# Patient Record
Sex: Female | Born: 1980 | Race: Black or African American | Hispanic: No | Marital: Single | Smoking: Never smoker
Health system: Southern US, Community
[De-identification: ages and names within clinical notes are randomized; demographics above are authoritative.]

## PROBLEM LIST (undated history)

## (undated) DIAGNOSIS — J302 Other seasonal allergic rhinitis: Secondary | ICD-10-CM

## (undated) DIAGNOSIS — B9689 Other specified bacterial agents as the cause of diseases classified elsewhere: Secondary | ICD-10-CM

## (undated) DIAGNOSIS — E786 Lipoprotein deficiency: Secondary | ICD-10-CM

## (undated) DIAGNOSIS — E669 Obesity, unspecified: Secondary | ICD-10-CM

## (undated) DIAGNOSIS — A749 Chlamydial infection, unspecified: Secondary | ICD-10-CM

## (undated) DIAGNOSIS — N760 Acute vaginitis: Secondary | ICD-10-CM

## (undated) DIAGNOSIS — N390 Urinary tract infection, site not specified: Secondary | ICD-10-CM

## (undated) HISTORY — DX: Other seasonal allergic rhinitis: J30.2

## (undated) HISTORY — DX: Obesity, unspecified: E66.9

## (undated) HISTORY — PX: TUBAL LIGATION: SHX77

## (undated) HISTORY — DX: Lipoprotein deficiency: E78.6

---
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
## 2001-01-25 ENCOUNTER — Emergency Department (HOSPITAL_COMMUNITY): Admission: EM | Admit: 2001-01-25 | Discharge: 2001-01-25 | Payer: Self-pay | Admitting: Emergency Medicine

## 2001-11-28 ENCOUNTER — Emergency Department (HOSPITAL_COMMUNITY): Admission: EM | Admit: 2001-11-28 | Discharge: 2001-11-28 | Payer: Self-pay | Admitting: Emergency Medicine

## 2002-10-02 ENCOUNTER — Other Ambulatory Visit: Admission: RE | Admit: 2002-10-02 | Discharge: 2002-10-02 | Payer: Self-pay | Admitting: Obstetrics and Gynecology

## 2002-10-02 ENCOUNTER — Other Ambulatory Visit: Admission: RE | Admit: 2002-10-02 | Discharge: 2002-10-02 | Payer: Self-pay | Admitting: Gynecology

## 2002-10-12 ENCOUNTER — Encounter: Payer: Self-pay | Admitting: Obstetrics and Gynecology

## 2002-10-12 ENCOUNTER — Inpatient Hospital Stay (HOSPITAL_COMMUNITY): Admission: AD | Admit: 2002-10-12 | Discharge: 2002-10-12 | Payer: Self-pay | Admitting: Obstetrics and Gynecology

## 2003-02-19 ENCOUNTER — Other Ambulatory Visit: Admission: RE | Admit: 2003-02-19 | Discharge: 2003-02-19 | Payer: Self-pay | Admitting: Obstetrics and Gynecology

## 2003-05-11 ENCOUNTER — Inpatient Hospital Stay (HOSPITAL_COMMUNITY): Admission: RE | Admit: 2003-05-11 | Discharge: 2003-05-14 | Payer: Self-pay | Admitting: Obstetrics & Gynecology

## 2003-05-25 ENCOUNTER — Inpatient Hospital Stay (HOSPITAL_COMMUNITY): Admission: AD | Admit: 2003-05-25 | Discharge: 2003-05-26 | Payer: Self-pay | Admitting: Obstetrics and Gynecology

## 2003-06-08 ENCOUNTER — Other Ambulatory Visit: Admission: RE | Admit: 2003-06-08 | Discharge: 2003-06-08 | Payer: Self-pay | Admitting: Obstetrics and Gynecology

## 2003-06-08 ENCOUNTER — Other Ambulatory Visit: Admission: RE | Admit: 2003-06-08 | Discharge: 2003-06-08 | Payer: Self-pay | Admitting: Gynecology

## 2004-02-12 ENCOUNTER — Ambulatory Visit: Payer: Self-pay | Admitting: Family Medicine

## 2004-02-16 ENCOUNTER — Ambulatory Visit: Payer: Self-pay | Admitting: *Deleted

## 2004-02-24 ENCOUNTER — Other Ambulatory Visit: Admission: RE | Admit: 2004-02-24 | Discharge: 2004-02-24 | Payer: Self-pay | Admitting: Obstetrics & Gynecology

## 2004-03-11 ENCOUNTER — Ambulatory Visit: Payer: Self-pay | Admitting: Family Medicine

## 2004-08-03 ENCOUNTER — Ambulatory Visit: Payer: Self-pay | Admitting: Family Medicine

## 2004-08-04 ENCOUNTER — Ambulatory Visit: Payer: Self-pay | Admitting: Family Medicine

## 2004-08-04 ENCOUNTER — Encounter (INDEPENDENT_AMBULATORY_CARE_PROVIDER_SITE_OTHER): Payer: Self-pay | Admitting: Family Medicine

## 2004-08-04 LAB — CONVERTED CEMR LAB: Pap Smear: NORMAL

## 2004-10-26 ENCOUNTER — Ambulatory Visit: Payer: Self-pay | Admitting: Family Medicine

## 2004-11-18 ENCOUNTER — Ambulatory Visit: Payer: Self-pay | Admitting: Family Medicine

## 2005-01-17 ENCOUNTER — Ambulatory Visit: Payer: Self-pay | Admitting: Family Medicine

## 2005-04-11 ENCOUNTER — Ambulatory Visit: Payer: Self-pay | Admitting: Family Medicine

## 2005-07-03 ENCOUNTER — Ambulatory Visit: Payer: Self-pay | Admitting: Family Medicine

## 2005-09-22 ENCOUNTER — Ambulatory Visit: Payer: Self-pay | Admitting: Family Medicine

## 2005-12-15 ENCOUNTER — Ambulatory Visit: Payer: Self-pay | Admitting: Family Medicine

## 2005-12-28 ENCOUNTER — Ambulatory Visit: Payer: Self-pay | Admitting: Family Medicine

## 2006-03-08 ENCOUNTER — Ambulatory Visit: Payer: Self-pay | Admitting: Family Medicine

## 2006-04-03 ENCOUNTER — Ambulatory Visit: Payer: Self-pay | Admitting: Family Medicine

## 2006-04-26 ENCOUNTER — Emergency Department (HOSPITAL_COMMUNITY): Admission: EM | Admit: 2006-04-26 | Discharge: 2006-04-26 | Payer: Self-pay | Admitting: Family Medicine

## 2006-05-31 ENCOUNTER — Ambulatory Visit: Payer: Self-pay | Admitting: Family Medicine

## 2006-08-22 ENCOUNTER — Ambulatory Visit: Payer: Self-pay | Admitting: Family Medicine

## 2006-10-30 ENCOUNTER — Emergency Department (HOSPITAL_COMMUNITY): Admission: EM | Admit: 2006-10-30 | Discharge: 2006-10-30 | Payer: Self-pay | Admitting: Emergency Medicine

## 2006-11-03 ENCOUNTER — Encounter (INDEPENDENT_AMBULATORY_CARE_PROVIDER_SITE_OTHER): Payer: Self-pay | Admitting: Family Medicine

## 2006-11-03 DIAGNOSIS — Z87448 Personal history of other diseases of urinary system: Secondary | ICD-10-CM | POA: Insufficient documentation

## 2006-11-20 ENCOUNTER — Ambulatory Visit: Payer: Self-pay | Admitting: Family Medicine

## 2006-11-20 ENCOUNTER — Encounter (INDEPENDENT_AMBULATORY_CARE_PROVIDER_SITE_OTHER): Payer: Self-pay | Admitting: Family Medicine

## 2006-11-20 LAB — CONVERTED CEMR LAB
Bilirubin Urine: NEGATIVE
Blood in Urine, dipstick: NEGATIVE
Chlamydia, DNA Probe: NEGATIVE
GC Probe Amp, Genital: NEGATIVE
Glucose, Urine, Semiquant: NEGATIVE
Ketones, urine, test strip: NEGATIVE
Nitrite: NEGATIVE
Protein, U semiquant: 30
Specific Gravity, Urine: >=1.03
Urobilinogen, UA: 0.2
WBC Urine, dipstick: NEGATIVE
pH: 6

## 2006-11-26 ENCOUNTER — Encounter (INDEPENDENT_AMBULATORY_CARE_PROVIDER_SITE_OTHER): Payer: Self-pay | Admitting: Family Medicine

## 2006-11-28 ENCOUNTER — Encounter (INDEPENDENT_AMBULATORY_CARE_PROVIDER_SITE_OTHER): Payer: Self-pay | Admitting: *Deleted

## 2007-01-31 ENCOUNTER — Telehealth (INDEPENDENT_AMBULATORY_CARE_PROVIDER_SITE_OTHER): Payer: Self-pay | Admitting: Family Medicine

## 2007-04-01 ENCOUNTER — Ambulatory Visit: Payer: Self-pay | Admitting: Family Medicine

## 2007-04-01 DIAGNOSIS — L639 Alopecia areata, unspecified: Secondary | ICD-10-CM | POA: Insufficient documentation

## 2007-04-01 DIAGNOSIS — N926 Irregular menstruation, unspecified: Secondary | ICD-10-CM | POA: Insufficient documentation

## 2007-09-17 ENCOUNTER — Ambulatory Visit: Payer: Self-pay | Admitting: Family Medicine

## 2007-11-11 ENCOUNTER — Emergency Department (HOSPITAL_COMMUNITY): Admission: EM | Admit: 2007-11-11 | Discharge: 2007-11-11 | Payer: Self-pay | Admitting: Family Medicine

## 2008-03-13 HISTORY — PX: COLPOSCOPY: SHX161

## 2008-08-19 ENCOUNTER — Inpatient Hospital Stay (HOSPITAL_COMMUNITY): Admission: AD | Admit: 2008-08-19 | Discharge: 2008-08-19 | Payer: Self-pay | Admitting: Obstetrics and Gynecology

## 2008-09-10 ENCOUNTER — Encounter (INDEPENDENT_AMBULATORY_CARE_PROVIDER_SITE_OTHER): Payer: Self-pay | Admitting: Obstetrics & Gynecology

## 2008-09-10 ENCOUNTER — Inpatient Hospital Stay (HOSPITAL_COMMUNITY): Admission: RE | Admit: 2008-09-10 | Discharge: 2008-09-12 | Payer: Self-pay | Admitting: Obstetrics & Gynecology

## 2009-02-27 IMAGING — CR DG CHEST 2V
2 series · 2 of 2 positions shown · non-contrast
Comparison: none

CLINICAL DATA: Right-sided chest pain. 
 CHEST - 2 VIEW:

[view not recorded (1 of 2)]
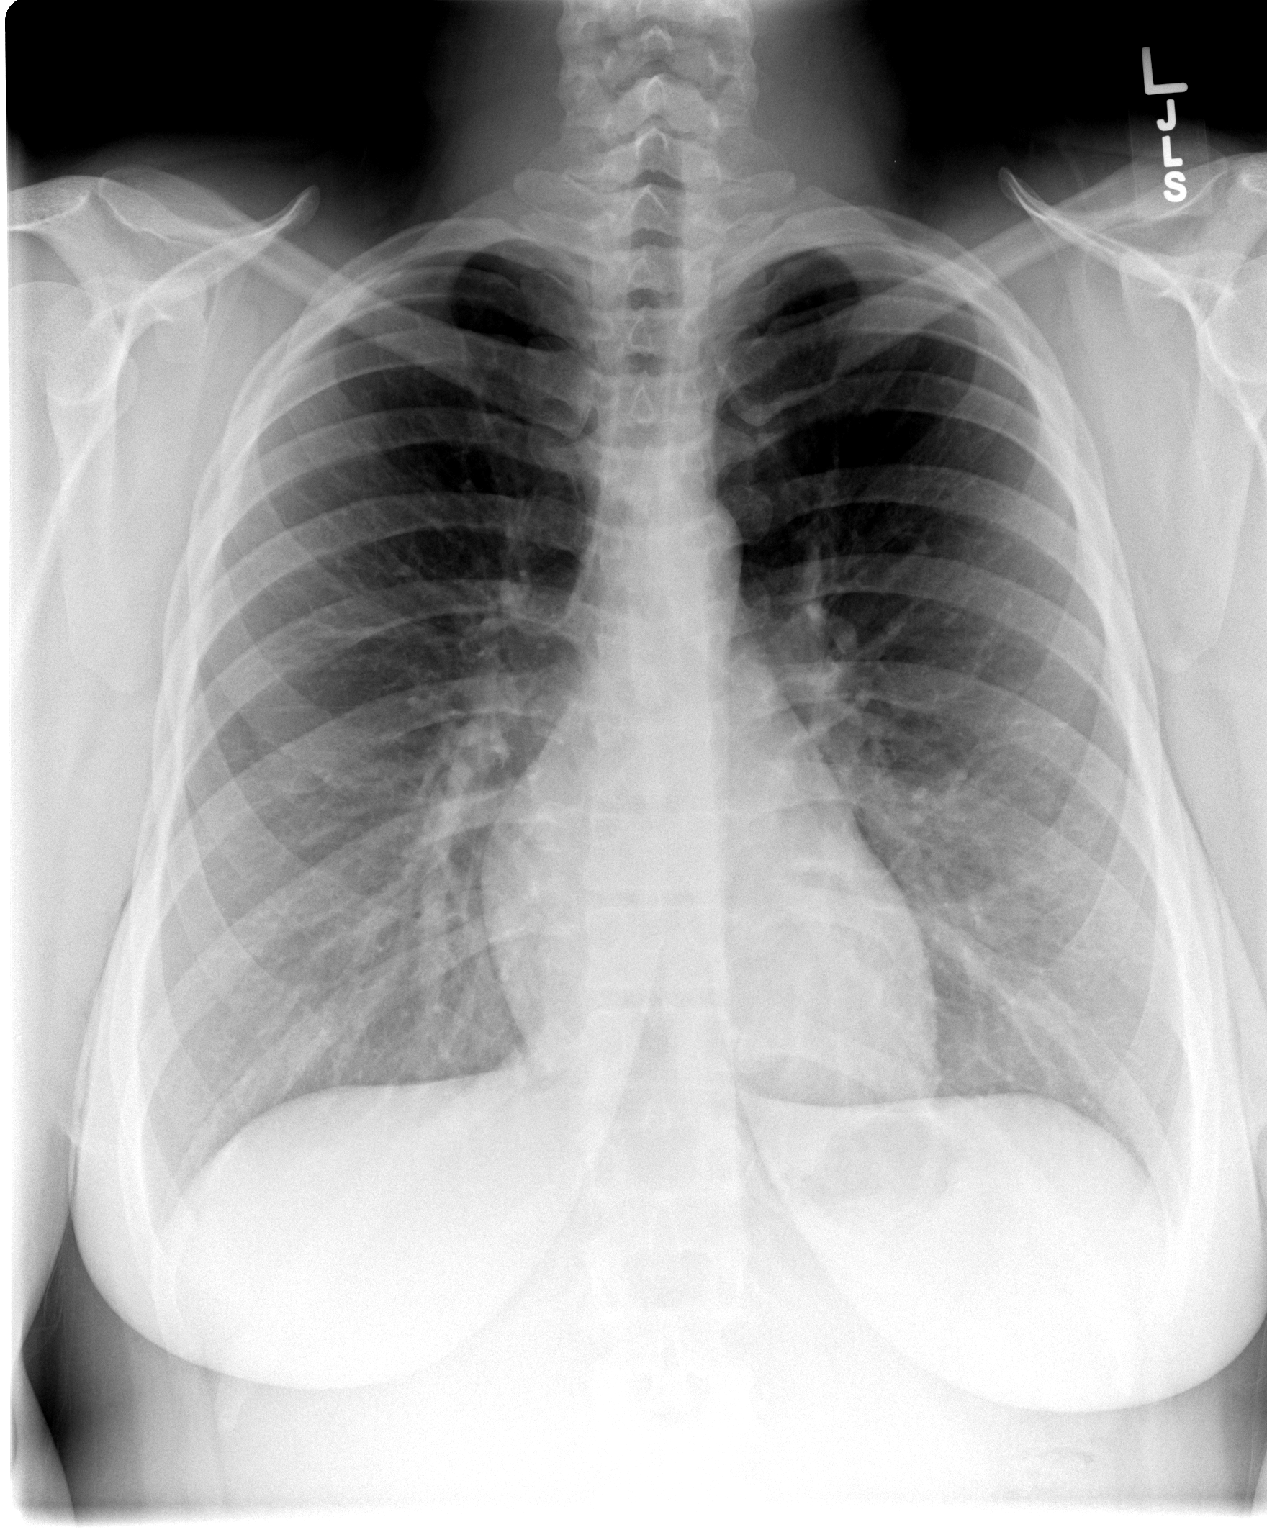

[view not recorded (2 of 2)]
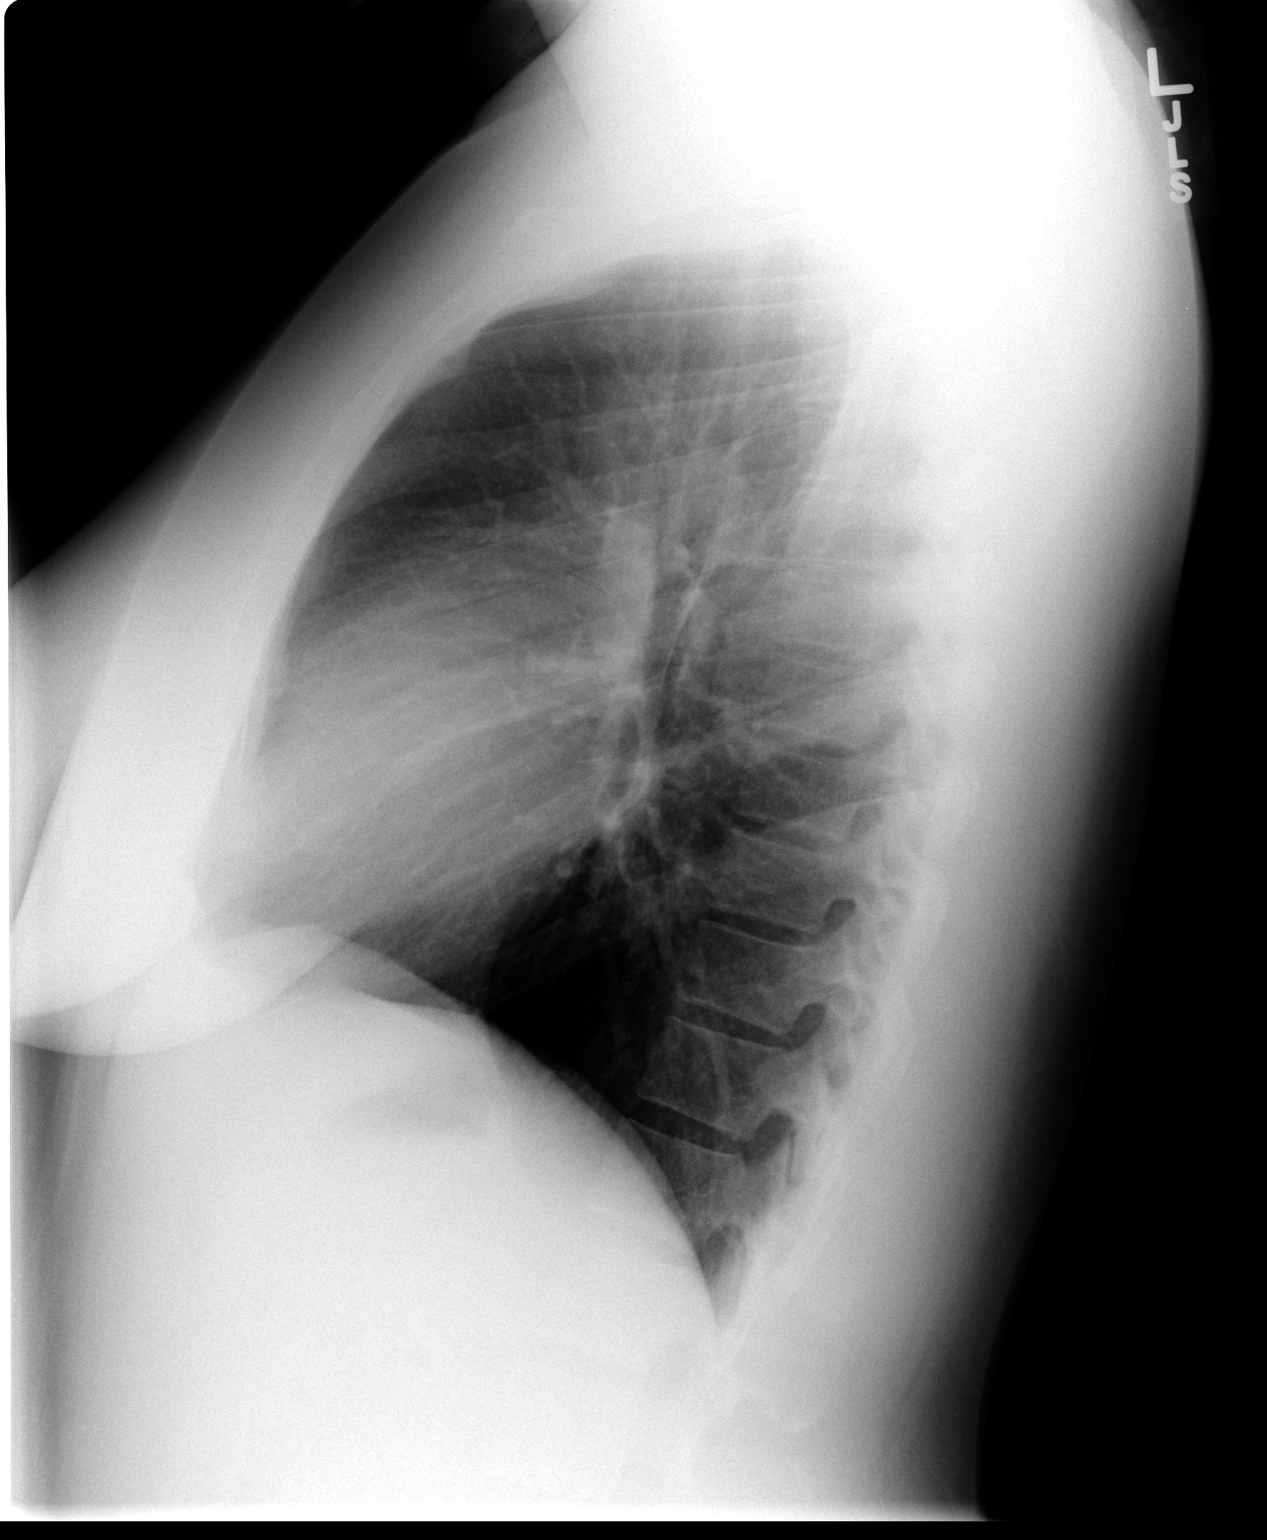

[2 of 2 positions shown; findings below may reference images not displayed]

FINDINGS: The heart size and mediastinal contours are within normal limits.  Both lungs are clear.  The visualized skeletal structures are unremarkable.
IMPRESSION: No active cardiopulmonary disease.

## 2010-01-12 ENCOUNTER — Emergency Department (HOSPITAL_COMMUNITY): Admission: EM | Admit: 2010-01-12 | Discharge: 2010-01-12 | Payer: Self-pay | Admitting: Family Medicine

## 2010-05-24 LAB — POCT URINALYSIS DIPSTICK
Glucose, UA: NEGATIVE mg/dL
Hgb urine dipstick: NEGATIVE
Ketones, ur: NEGATIVE mg/dL
Nitrite: NEGATIVE
Specific Gravity, Urine: >=1.03 (ref 1.005–1.030)
pH: 5.5 (ref 5.0–8.0)

## 2010-05-24 LAB — WET PREP, GENITAL: Yeast Wet Prep HPF POC: NONE SEEN

## 2010-05-24 LAB — GC/CHLAMYDIA PROBE AMP, GENITAL
Chlamydia, DNA Probe: NEGATIVE
GC Probe Amp, Genital: NEGATIVE

## 2010-06-19 LAB — CBC
HCT: 25.3 % — ABNORMAL LOW (ref 36.0–46.0)
Hemoglobin: 8.7 g/dL — ABNORMAL LOW (ref 12.0–15.0)
MCHC: 34.3 g/dL (ref 30.0–36.0)
Platelets: 117 10*3/uL — ABNORMAL LOW (ref 150–400)
RDW: 17.8 % — ABNORMAL HIGH (ref 11.5–15.5)

## 2010-06-20 LAB — CBC
MCHC: 34.2 g/dL (ref 30.0–36.0)
RBC: 3.89 MIL/uL (ref 3.87–5.11)
RDW: 17.5 % — ABNORMAL HIGH (ref 11.5–15.5)

## 2010-06-20 LAB — RPR: RPR Ser Ql: NONREACTIVE

## 2010-07-26 NOTE — Op Note (Signed)
NAMETERRION, POBLANO              ACCOUNT NO.:  [id number]   MEDICAL RECORD NO.:  [id number]          PATIENT TYPE:  INP   LOCATION:  9119                          FACILITY:  [facility]   PHYSICIAN:  Gerrit Friends. Aldona Bar, M.D.   DATE OF BIRTH:  27-Sep-1980   DATE OF PROCEDURE:  09/10/2008  DATE OF DISCHARGE:                               OPERATIVE REPORT   PREOPERATIVE DIAGNOSES:  Previous cesarean section x2, term pregnancy,  desire for permanent elective sterilization.   POSTOPERATIVE DIAGNOSES:  Previous cesarean section x2, term pregnancy,  desire for permanent elective sterilization, delivery of 8-pound 7-ounce  female infant, Apgars 9 and 9, and pathology pending on segment of each  fallopian tube.   PROCEDURES:  Repeat low transverse cesarean section and Pomeroy tubal  sterilization for permanent elective sterilization.   SURGEON:  Gerrit Friends. Aldona Bar, MD   ASSISTANT:  Luvenia Redden, MD   ANESTHESIA:  Spinal, Quillian Quince, MD   HISTORY:  This gravida 3, para 2 with a due date of September 16, 2008, is  being taken to the operating room for a repeat cesarean section at 39  weeks' gestation having been delivered previously by 2 cesarean  sections.  She also desires a permanent elective sterilization  procedure.  She is aware that such procedure is meant to be a 100%  permanent, but unfortunately it is not a 100% perfect as subsequent  pregnancy can result.   PROCEDURE IN DETAIL:  The patient was taken to the operating room, where  after the satisfactory induction of spinal anesthetic by Dr. Arby Barrette,  she was prepped and draped having placed in the supine position slightly  tilted left.  Foley catheter was placed as part of the prep.   After she was draped and good anesthetic levels were documented,  procedure was begun.  A Pfannenstiel incision was made through the old  scar, dissected down sharply to and through the fascia in a low  transverse fashion with hemostasis created at each  layer.  Subfascial  space was created inferiorly and superiorly.  Peritoneum was identified  and entered with care taken to avoid the bowel superiorly and bladder  inferiorly.  At this time, the vesicouterine peritoneum was incised and  pushed off the lower uterine segment with ease and sharp incision into  the uterus was then made with Metzenbaum scissors, extended laterally  with fingers.  Amniotomy was carried out with production of clear fluid  and thereafter from a vertex position, a viable female infant weighing 8  pounds 7 ounces was delivered.  There was a loose nuchal cord x1.  After  cord was clamped and cut, the infant was passed off the awaiting team.  Apgars were assigned at 9 and 9 and the infant was taken to the regular  nursery in good condition.   Cord bloods were collected and thereafter placenta was delivered intact.  Placenta was sent to Labor and Delivery.  At this time, the uterus was  exteriorized, rendered free of any remaining products of conception and  good uterine contractility was afforded with slowly given intravenous  Pitocin and manual stimulation.  At this time, the uterine incision was  closed using a single layer of #1 Vicryl in a running locking fashion  and several figure-of-eight #1 Vicryls were applied for additional  hemostasis.   At this time, the uterine incision was dry and attention was turned to  each fallopian tube which appeared normal as did both ovaries.  A  knuckle of tube was created in the right fallopian tube and a single  free tie #1 plain catgut suture tied about the knuckle and the knuckle  was excised and sent to Pathology.  Hemostasis was adequate.  Similar  procedure was carried out in the left fallopian tube.  At this time with  a tubal ligation procedure being complete and segments of both tubes  removed and the remaining tube hemostatic and the uterine incision  hemostatic, the abdomen was lavaged of all free blood and clot.   The  uterus was replaced in the abdominal cavity and after all counts were  noted to be correct and no foreign bodies were noted to be remaining in  the abdominal cavity, closure of the abdomen was begun in layers.  The  abdominal peritoneum was closed with 0 Vicryl in a running fashion.  Muscles were then reapproximated with interrupted 0 Vicryl.  Subfascial  space at this time was noted to be dry, and attention was turned to the  fascia.  Fascia was closed using running 0 Vicryl from angle to midline  bilaterally.  Assured of good subcutaneous hemostasis.  The fascia was  then closed with staples and the skin was closed with staples and  sterile pressure dressing was applied.  Estimated blood loss 500 mL.  All counts were correct x2.  At the conclusion of the procedure, both  mother and baby were doing well in the respective recovery areas.   Pathologic specimen included segments of each fallopian tube.  Condition  on arrival in the recovery room, satisfactory.      Gerrit Friends. Aldona Bar, M.D.  Electronically Signed     RMW/MEDQ  D:  09/10/2008  T:  09/10/2008  Job:  [id number]

## 2010-07-29 NOTE — Discharge Summary (Signed)
Joan Joyce, Joan Joyce NO.:  [id number]   MEDICAL RECORD NO.:  [id number]          PATIENT TYPE:  INP   LOCATION:  9119                          FACILITY:  [facility]   PHYSICIAN:  Carrington Clamp, M.D. DATE OF BIRTH:  May 27, 1980   DATE OF ADMISSION:  09/10/2008  DATE OF DISCHARGE:  09/12/2008                               DISCHARGE SUMMARY   FINAL DIAGNOSES:  Intrauterine pregnancy at term, history of previous  cesarean section x2, desires permanent elective sterilization.   PROCEDURES:  Repeat low transverse cesarean section and bilateral tubal  sterilization.   SURGEON:  Gerrit Friends. Aldona Bar, MD   ASSISTANT:  Luvenia Redden, MD   COMPLICATIONS:  None.   This 30 year old G3, P2 presents at term for repeat cesarean section.  The patient had 2 previous cesarean sections with her last deliveries  and we will have her repeat with this pregnancy as well.  The patient  also desires permanent elective sterilization, which she had been  counseled on.  Otherwise, the patient's antepartum course up to this  point had been uncomplicated.  The patient is taken to the operating  room on September 10, 2008, by Dr. Annamaria Helling, where a repeat low transverse  cesarean section was performed with the delivery of an 8-pound 7-ounce  female infant with Apgars of 9 and 9.  Delivery went without  complications.  At this point, Pomeroy tubal sterilization was performed  without complications.  The patient's postoperative course was benign  without any significant fevers.  The patient was felt ready for  discharge on postoperative day #2.  She was sent home on a regular diet,  told to decrease activities, told to continue her prenatal vitamins, her  iron supplement daily, was given Percocet 1-2 every 4-6 hours as needed  for pain, was told to follow up in our office in 4-6 weeks.  Instructions and precautions were reviewed with the patient.   LABORATORY FINDINGS ON DISCHARGE:  The patient had a  hemoglobin of 8.7,  which is down from a preoperative level of 10.7; white blood cell count  of 8.7 as well; and platelets of 117,000.      Leilani Able, P.A.-C.      Carrington Clamp, M.D.  Electronically Signed    MB/MEDQ  D:  10/01/2008  T:  10/02/2008  Job:  [id number]

## 2010-07-29 NOTE — Discharge Summary (Signed)
NAME:  Joan Joyce, Joan Joyce                        ACCOUNT NO.:  [id number]   MEDICAL RECORD NO.:  [id number]                   PATIENT TYPE:  INP   LOCATION:  9122                                 [facility]ACILITY:  [facility]   PHYSICIAN:  Malva Limes, M.D.                 DATE OF BIRTH:  1981-03-10   DATE OF ADMISSION:  05/11/2003  DATE OF DISCHARGE:  05/14/2003                                 DISCHARGE SUMMARY   FINAL DIAGNOSES:  1. Intrauterine pregnancy at term.  2. History of previous cesarean section and desires repeat cesarean section.   PROCEDURE:  Repeat low transverse cesarean section.  Surgeon:  Dr. Annamaria Helling.  Assistant:  Dr. Ilda Mori.  Complications:  None.   This 30 year old G2 P1 had a previous cesarean section with her last  pregnancy and desires a repeat cesarean section with this pregnancy.  The  patient's antepartum course had been complicated by an abnormal Pap smear  with some low-grade changes that was followed up at 28 weeks and then was  going to be followed up postpartum as well.  Otherwise, the patient's  antepartum course had been uncomplicated.  She was taken to the operating  room on May 11, 2003 by Dr. Annamaria Helling where repeat low transverse  cesarean section was performed with the delivery of an 8-pound 14-ounce  female infant with Apgars of 9 and 9.  Delivery went without complications.  The patient's postoperative course was benign without significant fevers.  She was felt ready for discharge on postoperative day #3.  She was sent home  on a regular diet, told to decrease activities, told to continue prenatal  vitamins, was given Percocet one to two q.4h. as needed for pain, told to  follow up in the office in 4 weeks.   LABORATORY DATA ON DISCHARGE:  The patient had a hemoglobin of 10.0 and  white blood cell count of 11.8.     Leilani Able, P.A.-C.                Malva Limes, M.D.    MB/MEDQ  D:  06/22/2003  T:  06/22/2003  Job:  [id number]

## 2010-07-29 NOTE — Op Note (Signed)
[facility] of [facility]  Patient:    Joan Joyce, Joan Joyce                     MRN: [id number] Proc. Date: 07/31/00 Adm. Date:  [id number] Attending:  Mickle Mallory                           Operative Report  PREOPERATIVE DIAGNOSIS:       41-week intrauterine pregnancy, spontaneous rupture of membranes x 12 hours, failure to progress - arrest disorder at 7 cm of dilatation, vertex -2 station.  POSTOPERATIVE DIAGNOSIS:      41-week intrauterine pregnancy, spontaneous rupture of membranes x 12 hours, failure to progress - arrest disorder at 7 cm of dilatation, vertex -2 station.  Plus delivery of 8 pound 8 ounce female infant with Apgars of 8 and 9.  OPERATION:                    Primary low transverse cesarean section.  SURGEON:                      Gerrit Friends. Aldona Bar, M.D.  ASSISTANT:  ANESTHESIA:                   Epidural.  ESTIMATED BLOOD LOSS:  INDICATIONS:                  This 30 year old primigravida was admitted at approximately 8:20 a.m. on May 21, with a history of spontaneous rupture of membranes x 1 hour.  She was having mild contractions at the time of admission.  At the time of admission her cervix was 3 cm dilated, posterior, and the vertex did not fit the cervix well and the vertex was at least -2 station.  Estimated fetal weight was approximately 7-1/2 pounds.  Fundus measured 40 cm and fetal heart was acceptable.  The patient was managed expectantly.  She eventually required high dose Pitocin with intrauterine pressure catheter placement to augment her labor. She had an epidural placed upon request.  She progressed very slowly through the first stage of labor and arrested at 7 cm of dilatation after at least 2+ hours of good labor.  The vertex was still at -2 station.  She was taken to the operating room for cesarean section with a preoperative diagnosis of failure to progress - arrest disorder at 7 cm.  DESCRIPTION OF PROCEDURE:     The  patient was taken to the operating room where the epidural was augmented.  The intrauterine pressure catheter had been removed prior to arrival in the OR.  A Foley catheter had been inserted prior to the arrival in the operating room.  Once in the operating room and once the epidural was augmented, the patient was prepped and draped having been placed in the supine position slightly tilted to the left.  Once the patient was adequately draped and good documentation of anesthesia noted, the procedure was begun.  A Pfannenstiel incision was made and dissected down to and through the fascia in a low transverse fashion without difficulty.  Hemostasis was created at each layer.  Subfascial space was created inferiorly and superiorly. The muscles were separated in the midline.  The peritoneum identified and entered appropriately with care taken to avoid the bowel superiorly and the bladder inferiorly.  At this time, the vesicouterine peritoneum was incised in a low transverse fashion, pushed off  the lower uterine segment with ease, and sharp incision to the uterus in the low transverse fashion was made in the lower segment with Metzenbaum scissors and extended with the fingers laterally.  At this time with some difficulty, a viable 8 pound 8 ounce female infant was delivered.  There was some difficulty delivering the shoulders.  Apgars were noted to be 8 and 9 and after the infant was delivered and the cord was clamped and cut, the infant was passed off to the awaiting pediatricians and thereafter taken to the nurser in good condition.  After the cord bloods were collected, the placenta was delivered intact. The uterus was then exteriorized and rendered free of any remaining products of conception.  Closure of the uterus was then carried out using a single layer of #1 Vicryl in a running locking fashion.  This was reinforced with several figure-of-eight #1 Vicryl sutures.  With good hemostasis of  the uterine incision noted and good contractility noted, tubes and ovaries were inspected and noted to be normal.  At this time the uterus was replaced into the abdomen after the abdomen was lavaged of all free blood and clot.  The counts at this time were noted to be correct - no foreign bodies were noted to be remaining within the abdominal cavity.  At this time, closure of the abdomen was carried out.  The abdominal peritoneum was closed with 0 Vicryl in a running fashion and muscles secured with same.  Assured of good subfascial hemostasis, the fascia was then reapproximated using 0 Vicryl from angle to midline bilaterally.  Subcutaneous tissue was then rendered hemostatic and staples were then used to close the skin.  A sterile pressure dressing was applied and at this time, the patient was transported to the recovery room in satisfactory condition having tolerated the procedure well.  The patient had good urine output during the procedure and at the conclusion of the procedure, both she and baby were doing well in their respective recovery areas.  Sponge, needle, and instrument counts were correct x 2.  In summary, this patient arrested at 7 cm of dilatation with the vertex remaining at -2 station and was delivered by cesarean section of an 8 pound 8 ounce female infant with Apgars of 8 and 9. DD:  07/31/00 TD:  08/01/00 Job: [id number]

## 2010-07-29 NOTE — Op Note (Signed)
NAME:  Joan Joyce, Joan Joyce                        ACCOUNT NO.:  [id number]   MEDICAL RECORD NO.:  [id number]                   PATIENT TYPE:  INP   LOCATION:  NA                                   FACILITY:  [facility]   PHYSICIAN:  Gerrit Friends. Aldona Bar, M.D.                DATE OF BIRTH:  16-Dec-1980   DATE OF PROCEDURE:  05/11/2003  DATE OF DISCHARGE:                                 OPERATIVE REPORT   PREOPERATIVE DIAGNOSES:  1. Term pregnancy.  2. Previous cesarean section.   POSTOPERATIVE DIAGNOSES:  1. Term pregnancy.  2. Previous cesarean section.  3. Delivery of 8 pound 14 ounce female infant, Apgars 9 and 9.   PROCEDURE:  Repeat low transverse cesarean section.   SURGEON:  Gerrit Friends. Aldona Bar, M.D.   ASSISTANT:  Ilda Mori, M.D.   ANESTHESIA:  Spinal, Quillian Quince, M.D.   HISTORY:  This 30 year old gravida 2, para 1, is now being taken to the  operating room for a repeat cesarean section at term.  Her pregnancy was  unremarkable.  Her due date is May 14, 2003.   DESCRIPTION OF PROCEDURE:  The patient was taken to the operating room,  where after the satisfactory induction of a spinal anesthetic placed by Dr.  Arby Barrette, she was prepped and draped, having been placed in a supine  position slightly tilted to the left.  A Foley catheter was placed as part  of the prep.   After the patient was adequately draped and good anesthetic levels were  documented, the procedure was begun.   A Pfannenstiel incision was made slightly above the previous Pfannenstiel  incision.  Subcutaneous tissue was rendered hemostatic down to the level of  the fascia.  The fascia was incised in a low transverse fashion and  subfascial space was created inferiorly and superiorly.  Muscles were  separated in the midline, the peritoneum identified and entered  appropriately with care taken to avoid the bowel superiorly and the bladder  inferiorly.  At this time the vesicouterine peritoneum was incised in a  low  transverse fashion and pushed off the lower uterine segment with ease.  Sharp incision in the uterus in a low transverse fashion was then made with  the Metzenbaum scissors and extended laterally with the fingers.  Amniotomy  was produced with production of clear fluid and thereafter delivery of a  viable female infant weighing 8 pounds 14 ounces was carried out from a  vertex position.  After the cord was clamped and cut, the infant was passed  off to the awaiting team.  The infant was subsequently taken to the nursery  in good condition.   After the cord bloods were collected, the placenta was delivered intact.  The uterus was then exteriorized and rendered free of any remaining products  of conception and then with good uterine contractility afforded was slowly  given intravenous Pitocin and manual stimulation.  The uterine incision was  closed using a single layer of #1 Vicryl in a running locking fashion.  At  this time the uterine incision was noted to be dry.  Tubes and ovaries were  noted to be normal.  The uterus was well-contracted.  At this time the  abdomen was lavaged of all free blood and clot and the uterus replaced in  the abdominal cavity.  Closure of the abdomen at this time was begun in  layers after all counts were noted to be correct and no foreign bodies were  noted to be remaining in the abdominal cavity.  The abdominal peritoneum was  closed at this time with 0 Vicryl in a running fashion, the muscles secured  with same.  Assured of good subfascial hemostasis, the fascia was then  reapproximated using 0 Vicryl from angle to midline bilaterally.  The  subcutaneous tissue was rendered hemostatic and staples were then used to  close the skin.  A sterile pressure dressing was applied and the patient at  this time was transported to the recovery room in satisfactory condition,  having tolerated the procedure well.  Estimated blood loss 500 mL.  All  counts correct  x2.   In summary, this patient presented at term having had a previous cesarean  section and was desirous of a repeat cesarean section, which was carried out  without difficulty with delivery of an 8 pound 14 ounce female infant with  Apgars of 9 and 9.  At the conclusion of the procedure both mother and baby  were doing well in their respective recovery areas.                                               Gerrit Friends. Aldona Bar, M.D.    RMW/MEDQ  D:  05/11/2003  T:  05/11/2003  Job:  [id number]

## 2010-07-29 NOTE — Discharge Summary (Signed)
[facility]  Patient:    Joan Joyce, Joan Joyce                     MRN: [id number] Adm. Date:  [id number] Disc. Date: [id number] Attending:  Mickle Mallory                           Discharge Summary  DISCHARGE DIAGNOSES:          1. Intrauterine pregnancy at 9 weeks estimated                                  gestational age.                               2. Failure to progress.                               3. Spontaneous rupture of membranes.  PROCEDURE:                    1. Primary low transverse cesarean section.                               2. Epidural anesthetic.  HISTORY OF PRESENT ILLNESS:   Ms. King is an 30 year old black female G1, P0 last menstrual period October 17, 1999, [facility] Jul 23, 2000 who presented at 41 weeks estimated gestational age complaining of spontaneous rupture of membranes.  Fluid was noted to be clear.  On admission the patients cervix was 3 cm dilated.  Patient was placed on Pitocin augmentation and epidural anesthetic was administered.  Patient reached 7 cm and failed to progress beyond this.  An IUPC had been placed to document adequate labor.  Patient was taken to the operating room by Dr. Annamaria Helling who performed a primary low transverse cesarean section without complications.  The patient delivered one live black female infant weighing 8 pounds 8 ounces.  Apgars were 8 at one minute and 9 at five minutes.  Estimated blood loss was 500 cc.  Postoperative period was complicated by one temperature elevation of 100.1 which resolved spontaneously.  Patient was discharged to home on postpartum day #3.  She did receive Depo-Provera prior to discharge.  Patient was instructed to follow-up in the office in four weeks.  She was sent home with Tylox to take p.r.n.  At the time of discharge patient was ambulating without difficulty.  The incision appeared to be healing well. DD:  08/22/00 TD:  08/22/00 Job:  [id number]

## 2010-10-14 ENCOUNTER — Inpatient Hospital Stay (INDEPENDENT_AMBULATORY_CARE_PROVIDER_SITE_OTHER)
Admission: RE | Admit: 2010-10-14 | Discharge: 2010-10-14 | Disposition: A | Discharge status: Home / Self Care | Payer: Self-pay | Source: Ambulatory Visit | Attending: Emergency Medicine | Admitting: Emergency Medicine

## 2010-10-14 DIAGNOSIS — J40 Bronchitis, not specified as acute or chronic: Secondary | ICD-10-CM

## 2011-08-17 ENCOUNTER — Emergency Department (INDEPENDENT_AMBULATORY_CARE_PROVIDER_SITE_OTHER)
Admission: EM | Admit: 2011-08-17 | Discharge: 2011-08-17 | Disposition: A | Discharge status: Home / Self Care | Payer: Self-pay | Source: Home / Self Care | Attending: Emergency Medicine | Admitting: Emergency Medicine

## 2011-08-17 ENCOUNTER — Encounter (HOSPITAL_COMMUNITY): Payer: Self-pay | Admitting: Emergency Medicine

## 2011-08-17 DIAGNOSIS — N390 Urinary tract infection, site not specified: Secondary | ICD-10-CM

## 2011-08-17 HISTORY — DX: Other specified bacterial agents as the cause of diseases classified elsewhere: N76.0

## 2011-08-17 HISTORY — DX: Other specified bacterial agents as the cause of diseases classified elsewhere: B96.89

## 2011-08-17 HISTORY — DX: Urinary tract infection, site not specified: N39.0

## 2011-08-17 HISTORY — DX: Chlamydial infection, unspecified: A74.9

## 2011-08-17 LAB — POCT URINALYSIS DIP (DEVICE)
Bilirubin Urine: NEGATIVE
Glucose, UA: NEGATIVE mg/dL
Nitrite: NEGATIVE
Urobilinogen, UA: 0.2 mg/dL (ref 0.0–1.0)

## 2011-08-17 LAB — POCT PREGNANCY, URINE: Preg Test, Ur: NEGATIVE

## 2011-08-17 MED ORDER — PHENAZOPYRIDINE HCL 200 MG PO TABS: 200.0000 mg | ORAL_TABLET | Freq: Three times a day (TID) | ORAL | Status: AC | PRN

## 2011-08-17 MED ORDER — NITROFURANTOIN MONOHYD MACRO 100 MG PO CAPS: 100.0000 mg | ORAL_CAPSULE | Freq: Two times a day (BID) | ORAL | Status: AC

## 2011-08-17 NOTE — ED Notes (Addendum)
PT HERE WITH C/O UTI SX FREQ/URGE/PRESSURE POST VOID AND CLOUDY URINE THAT STARTED X 3 DYS AGO.DENIES FEVER,CHILLS,N,V.NO VAG D/C OR BURN/ITCH.LMP 07/25/11

## 2011-08-17 NOTE — Discharge Instructions (Signed)
Go to www.goodrx.com to look up your medications. This will give you a list of where you can find your prescriptions at the most affordable prices.   Call [facility]  [phone]  If you have no primary doctor, here are some resources that may be helpful:  Medicaid-accepting [place] Providers:   - [facility]- [address]      [phone]      Mon-Fri 9am-7pm, Sat 9am-1pm   - [facility]- [address], [address]      [phone]   - [facility]- [address]      [phone]   - [facility]- [address] [place] [address]      [phone]   - [facility]- [address]      [phone]      Only accepts [place] Access Medicaid patients       after they have her name applied to their card   Self Pay (no insurance) in [place]:   - Sickle Cell Patients: Dr Eric Dean, [facility]      [address]      [phone]   - [facility]- [phone]   - Physician Referral Service- [phone]   - [facility]- [address]      [phone]   - [facility] [place]- [address]   - [facility]- see information above      (Speak to Pam H if you do not have insurance)   - [facility]- [address]      [phone]   - [facility] [place]- [address]      [phone]   - [facility]- [address] [place] [address]      [phone]   - Dr Osei-Bonsu-  [address], [place]      [phone]   - [facility]- [address]      [phone]   - [facility]- [address] [place] [address]      [phone]      Also [address]      [phone]   - [facility]- [address]      [phone]      1st and 3rd Saturday every month, 10am-1pm    Other agencies that provide inexpensive medical care:     [facility]  [phone]     Internal Medicine  [phone]    [facility]  [phone]    [facility] Clinic  [phone] [place] [address]    [facility]  [phone]    [facility]  [phone] [facility] [phone]   [address] [place], [place] [address]  Chronic Pain Problems Contact [facility]  [phone] Patients need to be referred by their primary care doctor.  [place] Resources  [facility] [place]     [employer]                          [place] [facility] [address] [place]                         [address]      [address]   [phone] (After Hours)  General Information: Finding a doctor when you do not have health insurance can be tricky. Although you are not limited by an insurance plan, you are of course limited by her finances and how much but he can pay out of pocket.  What are your options if you don't have health insurance?   1) Find a Librarian, academic and Pay Out of Pocket Although you won't have to find out who is covered by your insurance plan, it is a good idea to ask around and get recommendations. You will then need to call the office and see if the doctor you have chosen will accept you as a new patient and what types of options they offer for patients who are self-pay. Some doctors offer discounts or will set up payment plans for their patients who do not have insurance, but you will need to ask so you aren't surprised when you get to your appointment.  2) Contact Your Local Health Department Not all health departments have doctors that can see patients for sick visits, but many do, so it is worth a call to see if yours does. If you don't know where your local health department is, you can check in your phone book. The CDC also has a tool to help you locate your state's health department, and many state websites also have listings of  all of their local health departments.  3) Find a Walk-in Clinic If your illness is not likely to be very severe or complicated, you may want to try a walk in clinic. These are popping up all over the country in pharmacies, drugstores, and shopping centers. They're usually staffed by nurse practitioners or physician assistants that have been trained to treat common illnesses and complaints. They're usually fairly quick and inexpensive. However, if you have serious medical issues or chronic medical problems, these are probably not your best option  Urinary Tract Infection Infections of the urinary tract can start in several places. A bladder infection (cystitis), a kidney infection (pyelonephritis), and a prostate infection (prostatitis) are different types of urinary tract infections (UTIs). They usually get better if treated with medicines (antibiotics) that kill germs. Take all the medicine until it is gone. You or your child may feel better in a few days, but TAKE ALL MEDICINE or the infection may not respond and may become more difficult to treat. HOME CARE INSTRUCTIONS   Drink enough water and fluids to keep the urine clear or pale yellow. Cranberry juice is especially recommended, in addition to large amounts of water.   Avoid caffeine, tea, and carbonated beverages. They tend to irritate the bladder.   Alcohol may irritate the prostate.   Only take over-the-counter or prescription medicines for pain, discomfort, or fever as directed by your caregiver.  To prevent further infections:  Empty the bladder often. Avoid holding urine for long periods of time.   After a bowel movement, women should cleanse from front to back. Use each tissue only once.   Empty the bladder before and after sexual intercourse.  FINDING OUT THE RESULTS OF YOUR TEST Not all test results are available during your visit. If your or your child's test results are not back during the visit, make an appointment with  your caregiver to find out the results. Do not assume everything is normal if you have not heard from your caregiver or the medical facility.  It is important for you to follow up on all test results. SEEK MEDICAL CARE IF:   There is back pain.   Your baby is older than 3 months with a rectal temperature of 100.5 F (38.1 C) or higher for more than 1 day.   Your or your child's problems (symptoms) are no better in 3 days. Return sooner if you or your child is getting worse.  SEEK IMMEDIATE MEDICAL CARE IF:   There is severe back pain or lower abdominal pain.   You or your child develops chills.   You have a fever.   Your baby is older than 3 months with a rectal temperature of 102 F (38.9 C) or higher.   Your baby is 25 months old or younger with a rectal temperature of 100.4 F (38 C) or higher.   There is nausea or vomiting.   There is continued burning or discomfort with urination.  MAKE SURE YOU:   Understand these instructions.   Will watch your condition.   Will get help right away if you are not doing well or get worse.  Document Released: 12/07/2004 Document Revised: 02/16/2011 Document Reviewed: 07/12/2006 [facility] Patient Information 2012 [place], [place].

## 2011-08-17 NOTE — ED Provider Notes (Signed)
History     CSN: [id number]  Arrival date & time 08/17/11  1419   First MD Initiated Contact with Patient 08/17/11 1525      Chief Complaint  Patient presents with  . Urinary Tract Infection    (Consider location/radiation/quality/duration/timing/severity/associated sxs/prior treatment) HPI Comments: Patient with dysuria, urinary urgency, frequency, cloudy and oderous urine starting 3 days ago. Reports lower abdominal pressure after urinating. No aggravating or alleviating factors. Has not tried anything for this. No nausea, vomiting, fevers, back pain. No hematuria. No vaginal itching, genital rash, vaginal bleeding or discharge. She is sexually active with her husband, who is asymptomatic. STDs  not a concern today. She has a remote history of Chlamydia, and recurrent UTIs. States this feels identical to previous UTIs. Also history of BV. No history of any other STI's.  ROS as noted in HPI. All other ROS negative.   Patient is a 31 y.o. female presenting with urinary tract infection. The history is provided by the patient. No language interpreter was used.  Urinary Tract Infection This is a new problem. The current episode started more than 2 days ago. The problem occurs constantly. The problem has not changed since onset.Associated symptoms include abdominal pain. The symptoms are aggravated by nothing. The symptoms are relieved by nothing. She has tried nothing for the symptoms. The treatment provided no relief.    Past Medical History  Diagnosis Date  . UTI (lower urinary tract infection)   . Chlamydia   . BV (bacterial vaginosis)     Past Surgical History  Procedure Date  . Cesarean section   . Tubal ligation     No family history on file.  History  Substance Use Topics  . Smoking status: Never Smoker   . Smokeless tobacco: Not on file  . Alcohol Use: No    OB History    Grav Para Term Preterm Abortions TAB SAB Ect Mult Living                  Review of  Systems  Gastrointestinal: Positive for abdominal pain.    Allergies  Review of patient's allergies indicates no known allergies.  Home Medications   Current Outpatient Rx  Name Route Sig Dispense Refill  . NITROFURANTOIN MONOHYD MACRO 100 MG PO CAPS Oral Take 1 capsule (100 mg total) by mouth 2 (two) times daily. 10 capsule 0  . PHENAZOPYRIDINE HCL 200 MG PO TABS Oral Take 1 tablet (200 mg total) by mouth 3 (three) times daily as needed for pain. 6 tablet 0    Pulse 74  Temp(Src) 99.4 F (37.4 C) (Oral)  Resp 16  SpO2 100%  LMP 07/25/2011  Physical Exam  Nursing note and vitals reviewed. Constitutional: She is oriented to person, place, and time. She appears well-developed and well-nourished. No distress.  HENT:  Head: Normocephalic and atraumatic.  Eyes: Conjunctivae and EOM are normal.  Neck: Normal range of motion.  Cardiovascular: Normal rate.   Pulmonary/Chest: Effort normal. No respiratory distress.  Abdominal: Soft. Bowel sounds are normal. She exhibits no distension. There is tenderness in the suprapubic area. There is no rigidity, no guarding and no CVA tenderness.  Musculoskeletal: Normal range of motion.  Neurological: She is alert and oriented to person, place, and time.  Skin: Skin is warm and dry.  Psychiatric: She has a normal mood and affect. Her behavior is normal. Judgment and thought content normal.    ED Course  Procedures (including critical care time)  Labs Reviewed  POCT URINALYSIS DIP (DEVICE) - Abnormal; Notable for the following:    Hgb urine dipstick MODERATE (*)    Protein, ur 30 (*)    Leukocytes, UA SMALL (*) Biochemical Testing Only. Please order routine urinalysis from main lab if confirmatory testing is needed.   All other components within normal limits  POCT PREGNANCY, URINE   No results found.   1. UTI (lower urinary tract infection)     Results for orders placed during the hospital encounter of 08/17/11  POCT URINALYSIS DIP  (DEVICE)      Component Value Range   Glucose, UA NEGATIVE  NEGATIVE (mg/dL)   Bilirubin Urine NEGATIVE  NEGATIVE    Ketones, ur NEGATIVE  NEGATIVE (mg/dL)   Specific Gravity, Urine >=1.030  1.005 - 1.030    Hgb urine dipstick MODERATE (*) NEGATIVE    pH 6.5  5.0 - 8.0    Protein, ur 30 (*) NEGATIVE (mg/dL)   Urobilinogen, UA 0.2  0.0 - 1.0 (mg/dL)   Nitrite NEGATIVE  NEGATIVE    Leukocytes, UA SMALL (*) NEGATIVE   POCT PREGNANCY, URINE      Component Value Range   Preg Test, Ur NEGATIVE  NEGATIVE      MDM  Advised patient to increase her fluids, will send her home with Macrobid and Pyridium. Afebrile. No evidence of nephrolithiasis, pyelonephritis, vaginal infection at this time.  Luiz Blare, MD 08/17/11 [phone]

## 2012-12-31 ENCOUNTER — Ambulatory Visit (INDEPENDENT_AMBULATORY_CARE_PROVIDER_SITE_OTHER): Payer: BC Managed Care – PPO | Admitting: Medical

## 2012-12-31 ENCOUNTER — Encounter: Payer: Self-pay | Admitting: Medical

## 2012-12-31 VITALS — BP 100/70 | HR 79 | Temp 98.1°F | Resp 16 | Ht 64.0 in | Wt 204.0 lb

## 2012-12-31 DIAGNOSIS — Z113 Encounter for screening for infections with a predominantly sexual mode of transmission: Secondary | ICD-10-CM

## 2012-12-31 DIAGNOSIS — E669 Obesity, unspecified: Secondary | ICD-10-CM

## 2012-12-31 DIAGNOSIS — H579 Unspecified disorder of eye and adnexa: Secondary | ICD-10-CM

## 2012-12-31 DIAGNOSIS — Z0000 Encounter for general adult medical examination without abnormal findings: Secondary | ICD-10-CM

## 2012-12-31 DIAGNOSIS — Z124 Encounter for screening for malignant neoplasm of cervix: Secondary | ICD-10-CM

## 2012-12-31 NOTE — Progress Notes (Signed)
Subjective:   HPI  Joan Joyce is a 32 y.o. female who presents for a complete physical.  New patient today.     Preventative care: Last ophthalmology visit:n/a Last dental visit:n/a Last colonoscopy:n/a Last mammogram:n/a Last gynecological exam:2012- hospital Last HYQ:MVHQI Last labs:n/a, 4 years ago.  Prior vaccinations: TD or Tdap:2006 Influenza:no, declines Pneumococcal:n/a Shingles/Zostavax:n/a  Advanced directive:n/a Health care power of attorney:n/a Living will:n/a  Concerns: Gyn history of 3 prior pregnancies, 3 live births, 9yo daughter, 12yo son, 4yo son.    She has mole on right cheeck that she thinks is getting bigger.  Been there for years, but may be getting bigger.  No hx/o skin cancer in family.   She wants iron checked, was low in pregnancy.   She notes 7 day cycles, and has some brown discharge at the end.  Since last pregnancy having 1 day of spotting that occurs about 2 days after the menstrual cycle.  Cycles do seem to be regular.  First few days of periods are usually heavy ,but then it slows off.  Currently sexually active, same partner x 8 years.  Last STD testing about 5 years ago.   Last pap smear 2012, normal.  Has hx/o abnormal pap year ago, has procedure 2010, Dr. Kyung [facility]acca [facility].    Eats 3 times daily.   Trying to do better for meals.   Eating some wraps and salads, granola bars.   Drinks soda.    Reviewed their medical, surgical, family, social, medication, and allergy history and updated chart as appropriate.  Past Medical History  Diagnosis Date  . UTI (lower urinary tract infection)   . Chlamydia   . BV (bacterial vaginosis)   . Obesity   . Seasonal allergic rhinitis     Past Surgical History  Procedure Laterality Date  . Tubal ligation    . Cesarean section      x3; 2002,2005,2010    History   Social History  . Marital Status: Single    Spouse Name: N/A    Number of Children: N/A  . Years of Education: N/A    Occupational History  . Not on file.   Social History Main Topics  . Smoking status: Never Smoker   . Smokeless tobacco: Not on file  . Alcohol Use: No  . Drug Use: No  . Sexual Activity: Yes    Birth Control/ Protection: Surgical   Other Topics Concern  . Not on file   Social History Narrative   Single, works as Production designer, theatre/television/film at [employer], no exercise, 3 children    Family History  Problem Relation Age of Onset  . Cancer Mother     ovarian  . Hypertension Mother   . Diabetes Mother   . Cancer Father     lung  . Heart disease Maternal Grandmother   . Stroke Neg Hx   . Hyperlipidemia Neg Hx     No current outpatient prescriptions on file.  No Known Allergies   Review of Systems Constitutional: -fever, -chills, -sweats, -unexpected weight change, -decreased appetite, -fatigue Allergy: -sneezing, -itching, -congestion Dermatology: +changing moles, --rash, -lumps ENT: -runny nose, -ear pain, -sore throat, -hoarseness, -sinus pain, -teeth pain, - ringing in ears, -hearing loss, -nosebleeds Cardiology: -chest pain, -palpitations, -swelling, -difficulty breathing when lying flat, -waking up short of breath Respiratory: -cough, -shortness of breath, -difficulty breathing with exercise or exertion, -wheezing, -coughing up blood Gastroenterology: -abdominal pain, -nausea, -vomiting, -diarrhea, -constipation, -blood in stool, -changes in bowel movement, -difficulty  swallowing or eating Hematology: -bleeding, -bruising  Musculoskeletal: -joint aches, -muscle aches, -joint swelling, -back pain, -neck pain, -cramping, -changes in gait Ophthalmology: denies vision changes, eye redness, itching, discharge Urology: -burning with urination, -difficulty urinating, -blood in urine, -urinary frequency, -urgency, -incontinence Neurology: -headache, -weakness, -tingling, -numbness, -memory loss, -falls, -dizziness Psychology: -depressed mood, -agitation, -sleep problems     Objective:    Physical Exam  BP 100/70  Pulse 79  Temp(Src) 98.1 F (36.7 C) (Oral)  Resp 16  Ht 5\' 4"  (1.626 m)  Wt 204 lb (92.534 kg)  BMI 35 kg/m2  LMP 12/23/2012  General appearance: alert, no distress, WD/WN, AA female  Skin: Right cheek with slightly raised 3 mm x 2 mm brown uniform macule well-defined, several skin tags on neck bilaterally, other scattered benign-appearing macules throughout, no worrisome lesions HEENT: normocephalic, conjunctiva/corneas normal, sclerae anicteric, PERRLA, EOMi, nares patent, no discharge or erythema, pharynx normal Oral cavity: MMM, tongue normal, teeth with moderate plaque, otherwise in good repair Neck: supple, no lymphadenopathy, no thyromegaly, no masses, normal ROM, no bruits Chest: non tender, normal shape and expansion Heart: RRR, normal S1, S2, no murmurs Lungs: CTA bilaterally, no wheezes, rhonchi, or rales Abdomen: +bs, soft, non tender, non distended, no masses, no hepatomegaly, no splenomegaly, no bruits Back: non tender, normal ROM, no scoliosis Musculoskeletal: upper extremities non tender, no obvious deformity, normal ROM throughout, lower extremities non tender, no obvious deformity, normal ROM throughout Extremities: no edema, no cyanosis, no clubbing Pulses: 2+ symmetric, upper and lower extremities, normal cap refill Neurological: alert, oriented x 3, CN2-12 intact, strength normal upper extremities and lower extremities, sensation normal throughout, DTRs 2+ throughout, no cerebellar signs, gait normal Psychiatric: normal affect, behavior normal, pleasant  Breast/gyn/rectal - deferred   Assessment and Plan :    Encounter Diagnoses  Name Primary?  . Routine general medical examination at a health care facility Yes  . Obesity, unspecified   . Abnormal vision screen   . Screening for cervical cancer   . Screen for STD (sexually transmitted disease)     Physical exam - discussed healthy lifestyle, diet, exercise, preventative care,  vaccinations, and addressed their concerns.  Handout given. Advise she establish with a dentist  Obesity-discussed needed lifestyle changes, weight loss Abnormal vision screen---- establish with ophthalmology/optometry Screening for cervical cancer and STD screening-she'll return for Pap and STD screen Follow-up fasting for labs, return at her convenience for breast and pelvic exam.

## 2012-12-31 NOTE — Patient Instructions (Signed)
Ophthalmology Dr. Glenford Peers [address] [place], [place] [address] [phone]   [facility] [address] Gelene Mink [address], [place]. [address] [place], [place] [address]  [phone] [email or web address]   Vincenza Hews, M.D. Susanne Greenhouse, O.D. [address] [place], [place] [address] Medical telephone: [phone] Optical telephone: [phone]   Dr. Yancey Flemings, dentist [address], [place], [place] [address] [phone] [email or web address]   Return for fasting labs, then return at your convenience for breast and pelvic exam.   Preventative Care for Adults - Female      MAINTAIN REGULAR HEALTH EXAMS:  A routine yearly physical is a good way to check in with your primary care provider about your health and preventive screening. It is also an opportunity to share updates about your health and any concerns you have, and receive a thorough all-over exam.   Most health insurance companies pay for at least some preventative services.  Check with your health plan for specific coverages.  WHAT PREVENTATIVE SERVICES DO WOMEN NEED?  Adult women should have their weight and blood pressure checked regularly.   Women age 74 and older should have their cholesterol levels checked regularly.  Women should be screened for cervical cancer with a Pap smear and pelvic exam beginning at either age 55, or 3 years after they become sexually activity.    Breast cancer screening generally begins at age 63 with a mammogram and breast exam by your primary care provider.    Beginning at age 27 and continuing to age 12, women should be screened for colorectal cancer.  Certain people may need continued testing until age 77.  Updating vaccinations is part of preventative care.  Vaccinations help protect against diseases such as the flu.  Osteoporosis is a disease in which the bones lose minerals and strength as we age. Women ages 36 and over should discuss this  with their caregivers, as should women after menopause who have other risk factors.  Lab tests are generally done as part of preventative care to screen for anemia and blood disorders, to screen for problems with the kidneys and liver, to screen for bladder problems, to check blood sugar, and to check your cholesterol level.  Preventative services generally include counseling about diet, exercise, avoiding tobacco, drugs, excessive alcohol consumption, and sexually transmitted infections.    GENERAL RECOMMENDATIONS FOR GOOD HEALTH:  Healthy diet:  Eat a variety of foods, including fruit, vegetables, animal or vegetable protein, such as meat, fish, chicken, and eggs, or beans, lentils, tofu, and grains, such as rice.  Drink plenty of water daily.  Decrease saturated fat in the diet, avoid lots of red meat, processed foods, sweets, fast foods, and fried foods.  Exercise:  Aerobic exercise helps maintain good heart health. At least 30-40 minutes of moderate-intensity exercise is recommended. For example, a brisk walk that increases your heart rate and breathing. This should be done on most days of the week.   Find a type of exercise or a variety of exercises that you enjoy so that it becomes a part of your daily life.  Examples are running, walking, swimming, water aerobics, and biking.  For motivation and support, explore group exercise such as aerobic class, spin class, Zumba, Yoga,or  martial arts, etc.    Set exercise goals for yourself, such as a certain weight goal, walk or run in a race such as a 5k walk/run.  Speak to your primary care provider about exercise goals.  Disease prevention:  If you smoke or chew tobacco, find out from your caregiver how to quit. It can literally save your life, no matter how long you have been a tobacco user. If you do not use tobacco, never begin.   Maintain a healthy diet and normal weight. Increased weight leads to problems with blood pressure and  diabetes.   The Body Mass Index or BMI is a way of measuring how much of your body is fat. Having a BMI above 27 increases the risk of heart disease, diabetes, hypertension, stroke and other problems related to obesity. Your caregiver can help determine your BMI and based on it develop an exercise and dietary program to help you achieve or maintain this important measurement at a healthful level.  High blood pressure causes heart and blood vessel problems.  Persistent high blood pressure should be treated with medicine if weight loss and exercise do not work.   Fat and cholesterol leaves deposits in your arteries that can block them. This causes heart disease and vessel disease elsewhere in your body.  If your cholesterol is found to be high, or if you have heart disease or certain other medical conditions, then you may need to have your cholesterol monitored frequently and be treated with medication.   Ask if you should have a cardiac stress test if your history suggests this. A stress test is a test done on a treadmill that looks for heart disease. This test can find disease prior to there being a problem.  Menopause can be associated with physical symptoms and risks. Hormone replacement therapy is available to decrease these. You should talk to your caregiver about whether starting or continuing to take hormones is right for you.   Osteoporosis is a disease in which the bones lose minerals and strength as we age. This can result in serious bone fractures. Risk of osteoporosis can be identified using a bone density scan. Women ages 4 and over should discuss this with their caregivers, as should women after menopause who have other risk factors. Ask your caregiver whether you should be taking a calcium supplement and Vitamin D, to reduce the rate of osteoporosis.   Avoid drinking alcohol in excess (more than two drinks per day).  Avoid use of street drugs. Do not share needles with anyone. Ask for  professional help if you need assistance or instructions on stopping the use of alcohol, cigarettes, and/or drugs.  Brush your teeth twice a day with fluoride toothpaste, and floss once a day. Good oral hygiene prevents tooth decay and gum disease. The problems can be painful, unattractive, and can cause other health problems. Visit your dentist for a routine oral and dental check up and preventive care every 6-12 months.   Look at your skin regularly.  Use a mirror to look at your back. Notify your caregivers of changes in moles, especially if there are changes in shapes, colors, a size larger than a pencil eraser, an irregular border, or development of new moles.  Safety:  Use seatbelts 100% of the time, whether driving or as a passenger.  Use safety devices such as hearing protection if you work in environments with loud noise or significant background noise.  Use safety glasses when doing any work that could send debris in to the eyes.  Use a helmet if you ride a bike or motorcycle.  Use appropriate safety gear for contact sports.  Talk to your caregiver about gun safety.  Use sunscreen with a  SPF (or skin protection factor) of 15 or greater.  Lighter skinned people are at a greater risk of skin cancer. Don't forget to also wear sunglasses in order to protect your eyes from too much damaging sunlight. Damaging sunlight can accelerate cataract formation.   Practice safe sex. Use condoms. Condoms are used for birth control and to help reduce the spread of sexually transmitted infections (or STIs).  Some of the STIs are gonorrhea (the clap), chlamydia, syphilis, trichomonas, herpes, HPV (human papilloma virus) and HIV (human immunodeficiency virus) which causes AIDS. The herpes, HIV and HPV are viral illnesses that have no cure. These can result in disability, cancer and death.   Keep carbon monoxide and smoke detectors in your home functioning at all times. Change the batteries every 6 months or use  a model that plugs into the wall.   Vaccinations:  Stay up to date with your tetanus shots and other required immunizations. You should have a booster for tetanus every 10 years. Be sure to get your flu shot every year, since 5%-20% of the [place] population comes down with the flu. The flu vaccine changes each year, so being vaccinated once is not enough. Get your shot in the fall, before the flu season peaks.   Other vaccines to consider:  Human Papilloma Virus or HPV causes cancer of the cervix, and other infections that can be transmitted from person to person. There is a vaccine for HPV, and females should get immunized between the ages of 36 and 39. It requires a series of 3 shots.   Pneumococcal vaccine to protect against certain types of pneumonia.  This is normally recommended for adults age 31 or older.  However, adults younger than 31 years old with certain underlying conditions such as diabetes, heart or lung disease should also receive the vaccine.  Shingles vaccine to protect against Varicella Zoster if you are older than age 15, or younger than 31 years old with certain underlying illness.  Hepatitis A vaccine to protect against a form of infection of the liver by a virus acquired from food.  Hepatitis B vaccine to protect against a form of infection of the liver by a virus acquired from blood or body fluids, particularly if you work in health care.  If you plan to travel internationally, check with your local health department for specific vaccination recommendations.  Cancer Screening:  Breast cancer screening is essential to preventive care for women. All women age 64 and older should perform a breast self-exam every month. At age 34 and older, women should have their caregiver complete a breast exam each year. Women at ages 76 and older should have a mammogram (x-ray film) of the breasts. Your caregiver can discuss how often you need mammograms.    Cervical cancer screening  includes taking a Pap smear (sample of cells examined under a microscope) from the cervix (end of the uterus). It also includes testing for HPV (Human Papilloma Virus, which can cause cervical cancer). Screening and a pelvic exam should begin at age 15, or 3 years after a woman becomes sexually active. Screening should occur every year, with a Pap smear but no HPV testing, up to age 75. After age 15, you should have a Pap smear every 3 years with HPV testing, if no HPV was found previously.   Most routine colon cancer screening begins at the age of 24. On a yearly basis, doctors may provide special easy to use take-home tests to check for hidden  blood in the stool. Sigmoidoscopy or colonoscopy can detect the earliest forms of colon cancer and is life saving. These tests use a small camera at the end of a tube to directly examine the colon. Speak to your caregiver about this at age 30, when routine screening begins (and is repeated every 5 years unless early forms of pre-cancerous polyps or small growths are found).

## 2013-01-01 ENCOUNTER — Other Ambulatory Visit: Payer: BC Managed Care – PPO

## 2013-01-01 DIAGNOSIS — Z113 Encounter for screening for infections with a predominantly sexual mode of transmission: Secondary | ICD-10-CM

## 2013-01-01 DIAGNOSIS — Z0000 Encounter for general adult medical examination without abnormal findings: Secondary | ICD-10-CM

## 2013-01-01 LAB — CBC WITH DIFFERENTIAL/PLATELET
Basophils Absolute: 0 10*3/uL (ref 0.0–0.1)
Basophils Relative: 0 % (ref 0–1)
Eosinophils Absolute: 0.2 10*3/uL (ref 0.0–0.7)
Eosinophils Relative: 3 % (ref 0–5)
Lymphs Abs: 1.5 10*3/uL (ref 0.7–4.0)
MCH: 21.1 pg — ABNORMAL LOW (ref 26.0–34.0)
MCHC: 31.8 g/dL (ref 30.0–36.0)
MCV: 66.1 fL — ABNORMAL LOW (ref 78.0–100.0)
Platelets: 250 10*3/uL (ref 150–400)
RDW: 18.1 % — ABNORMAL HIGH (ref 11.5–15.5)

## 2013-01-01 LAB — COMPREHENSIVE METABOLIC PANEL
ALT: 9 U/L (ref 0–35)
AST: 13 U/L (ref 0–37)
Alkaline Phosphatase: 42 U/L (ref 39–117)
Potassium: 4.2 mEq/L (ref 3.5–5.3)
Sodium: 137 mEq/L (ref 135–145)
Total Bilirubin: 0.4 mg/dL (ref 0.3–1.2)
Total Protein: 7.1 g/dL (ref 6.0–8.3)

## 2013-01-01 LAB — RPR

## 2013-01-01 LAB — LIPID PANEL: Total CHOL/HDL Ratio: 3.6 Ratio

## 2013-01-02 LAB — HIV ANTIBODY (ROUTINE TESTING W REFLEX): HIV: NONREACTIVE

## 2013-01-16 ENCOUNTER — Other Ambulatory Visit: Payer: Self-pay

## 2013-03-13 DIAGNOSIS — E786 Lipoprotein deficiency: Secondary | ICD-10-CM

## 2013-03-13 HISTORY — DX: Lipoprotein deficiency: E78.6

## 2013-05-13 ENCOUNTER — Encounter: Payer: Self-pay | Admitting: Medical

## 2013-05-13 ENCOUNTER — Ambulatory Visit (INDEPENDENT_AMBULATORY_CARE_PROVIDER_SITE_OTHER): Payer: BC Managed Care – PPO | Admitting: Medical

## 2013-05-13 VITALS — BP 110/80 | HR 88 | Temp 98.2°F | Resp 16 | Wt 200.0 lb

## 2013-05-13 DIAGNOSIS — E786 Lipoprotein deficiency: Secondary | ICD-10-CM

## 2013-05-13 DIAGNOSIS — D509 Iron deficiency anemia, unspecified: Secondary | ICD-10-CM | POA: Insufficient documentation

## 2013-05-13 DIAGNOSIS — Z309 Encounter for contraceptive management, unspecified: Secondary | ICD-10-CM

## 2013-05-13 DIAGNOSIS — D649 Anemia, unspecified: Secondary | ICD-10-CM

## 2013-05-13 DIAGNOSIS — E669 Obesity, unspecified: Secondary | ICD-10-CM

## 2013-05-13 LAB — CBC WITH DIFFERENTIAL/PLATELET
BASOS ABS: 0 10*3/uL (ref 0.0–0.1)
BASOS PCT: 0 % (ref 0–1)
Eosinophils Absolute: 0.2 10*3/uL (ref 0.0–0.7)
Eosinophils Relative: 3 % (ref 0–5)
HEMATOCRIT: 30 % — AB (ref 36.0–46.0)
Hemoglobin: 9.1 g/dL — ABNORMAL LOW (ref 12.0–15.0)
LYMPHS PCT: 35 % (ref 12–46)
Lymphs Abs: 2.5 10*3/uL (ref 0.7–4.0)
MCH: 19.8 pg — ABNORMAL LOW (ref 26.0–34.0)
MCHC: 30.3 g/dL (ref 30.0–36.0)
MCV: 65.2 fL — ABNORMAL LOW (ref 78.0–100.0)
MONO ABS: 0.6 10*3/uL (ref 0.1–1.0)
Monocytes Relative: 8 % (ref 3–12)
NEUTROS ABS: 3.9 10*3/uL (ref 1.7–7.7)
NEUTROS PCT: 54 % (ref 43–77)
Platelets: 284 10*3/uL (ref 150–400)
RBC: 4.6 MIL/uL (ref 3.87–5.11)
RDW: 18 % — AB (ref 11.5–15.5)
WBC: 7.2 10*3/uL (ref 4.0–10.5)

## 2013-05-13 LAB — IRON AND TIBC
%SAT: 3 % — AB (ref 20–55)
IRON: 13 ug/dL — AB (ref 42–145)
TIBC: 416 ug/dL (ref 250–470)
UIBC: 403 ug/dL — ABNORMAL HIGH (ref 125–400)

## 2013-05-13 LAB — POCT URINE PREGNANCY: PREG TEST UR: NEGATIVE

## 2013-05-13 MED ORDER — NORETHINDRONE ACETATE 5 MG PO TABS: 5.0000 mg | ORAL_TABLET | Freq: Every day | ORAL | Status: DC

## 2013-05-13 NOTE — Progress Notes (Signed)
   Subjective:   Joan Joyce is a 33 y.o. female presenting on 05/13/2013 with discuss lab results  I saw her in October 2014 as a new patient for a physical.  She is here to discuss lab results and other concerns.  She was found to have anemia in October.  She notes prior anemia with pregnancy only.  Periods are regular but heavy, lasting a full week.  Has been on iron in the past.   Denies any other bleeding.  Denies fatigue, pallor, palpitations, SOB.  No family hx/o anemia.  Her LMP stopped yesterday.   She is going on vacation in about 3 weeks and would like a medication to postpone her periods.  She has had a tubal ligation.  She doesn't like taking pills every day, but would like to take something to offset her period.  Last pap was 1-2 years ago.    Review of Systems ROS as in subjective      Objective:     Filed Vitals:   05/13/13 1609  BP: 110/80  Pulse: 88  Temp: 98.2 F (36.8 C)  Resp: 16    General appearance: alert, no distress, WD/WN      Assessment: Encounter Diagnoses  Name Primary?  Marland Kitchen. Anemia Yes  . Contraception management   . Low HDL (under 40)   . Obesity, unspecified      Plan: Anemia - discussed prior labs.  Repeat labs today.  Anemia likely due to iron deficiency from heavy periods.    contraception management - discussed options.   upreg negative.  Will use trial of medication below a few days before her period to post pone bleeding until after her period.  Low HDL - discussed findings , significance, and work on diet changes  Obesity - advised lifestyle changes  Joan Joyce was seen today for discuss lab results.  Diagnoses and associated orders for this visit:  Anemia - Iron and TIBC - CBC with Differential  Contraception management - POCT urine pregnancy  Low HDL (under 40)  Obesity, unspecified  Other Orders - norethindrone (AYGESTIN) 5 MG tablet; Take 1 tablet (5 mg total) by mouth daily.    Return pending labs, 1-2  mo for pap.

## 2013-05-14 ENCOUNTER — Telehealth: Payer: Self-pay | Admitting: Family Medicine

## 2013-05-14 ENCOUNTER — Other Ambulatory Visit: Payer: Self-pay | Admitting: Medical

## 2013-05-14 MED ORDER — FERROUS SULFATE 325 (65 FE) MG PO TABS: 325.0000 mg | ORAL_TABLET | Freq: Three times a day (TID) | ORAL | Status: DC

## 2013-05-14 NOTE — Telephone Encounter (Signed)
Patient is aware of her instructions for her new medication that [facility]. CLS

## 2013-05-14 NOTE — Telephone Encounter (Signed)
Message copied by Janeice RobinsonSCALES, Enedina Pair L on Wed May 14, 2013 11:34 AM ------      Message from: Jac CanavanYSINGER, DAVID S      Created: Tue May 13, 2013  9:00 PM       Please give her these directions: use the medication that was sent once daily starting a few days prior to her period, take daily during her vacation, and can stop the day she gets back. ------

## 2013-06-06 ENCOUNTER — Other Ambulatory Visit: Payer: BC Managed Care – PPO | Admitting: Medical

## 2014-08-31 ENCOUNTER — Encounter: Payer: Self-pay | Admitting: Medical

## 2014-09-02 ENCOUNTER — Other Ambulatory Visit (HOSPITAL_COMMUNITY)
Admission: RE | Admit: 2014-09-02 | Discharge: 2014-09-02 | Disposition: A | Discharge status: Home / Self Care | Payer: BLUE CROSS/BLUE SHIELD | Source: Ambulatory Visit | Attending: Medical | Admitting: Medical

## 2014-09-02 ENCOUNTER — Encounter: Payer: Self-pay | Admitting: Medical

## 2014-09-02 ENCOUNTER — Ambulatory Visit (INDEPENDENT_AMBULATORY_CARE_PROVIDER_SITE_OTHER): Payer: BLUE CROSS/BLUE SHIELD | Admitting: Medical

## 2014-09-02 VITALS — BP 100/70 | HR 88 | Temp 98.4°F | Ht 65.5 in | Wt 207.0 lb

## 2014-09-02 DIAGNOSIS — Z124 Encounter for screening for malignant neoplasm of cervix: Secondary | ICD-10-CM | POA: Diagnosis not present

## 2014-09-02 DIAGNOSIS — Z1151 Encounter for screening for human papillomavirus (HPV): Secondary | ICD-10-CM | POA: Diagnosis present

## 2014-09-02 DIAGNOSIS — L089 Local infection of the skin and subcutaneous tissue, unspecified: Secondary | ICD-10-CM | POA: Diagnosis not present

## 2014-09-02 DIAGNOSIS — Z23 Encounter for immunization: Secondary | ICD-10-CM

## 2014-09-02 DIAGNOSIS — Z113 Encounter for screening for infections with a predominantly sexual mode of transmission: Secondary | ICD-10-CM

## 2014-09-02 DIAGNOSIS — D509 Iron deficiency anemia, unspecified: Secondary | ICD-10-CM | POA: Diagnosis not present

## 2014-09-02 DIAGNOSIS — H579 Unspecified disorder of eye and adnexa: Secondary | ICD-10-CM | POA: Diagnosis not present

## 2014-09-02 DIAGNOSIS — Z0000 Encounter for general adult medical examination without abnormal findings: Secondary | ICD-10-CM | POA: Diagnosis not present

## 2014-09-02 DIAGNOSIS — R748 Abnormal levels of other serum enzymes: Secondary | ICD-10-CM | POA: Diagnosis not present

## 2014-09-02 DIAGNOSIS — Z01419 Encounter for gynecological examination (general) (routine) without abnormal findings: Secondary | ICD-10-CM | POA: Insufficient documentation

## 2014-09-02 LAB — COMPREHENSIVE METABOLIC PANEL
ALBUMIN: 4.3 g/dL (ref 3.5–5.2)
ALT: 11 U/L (ref 0–35)
AST: 13 U/L (ref 0–37)
Alkaline Phosphatase: 43 U/L (ref 39–117)
BUN: 9 mg/dL (ref 6–23)
CO2: 21 meq/L (ref 19–32)
Calcium: 9.1 mg/dL (ref 8.4–10.5)
Chloride: 104 mEq/L (ref 96–112)
Creat: 0.69 mg/dL (ref 0.50–1.10)
Glucose, Bld: 83 mg/dL (ref 70–99)
POTASSIUM: 4.2 meq/L (ref 3.5–5.3)
SODIUM: 138 meq/L (ref 135–145)
TOTAL PROTEIN: 7.3 g/dL (ref 6.0–8.3)
Total Bilirubin: 0.4 mg/dL (ref 0.2–1.2)

## 2014-09-02 LAB — CBC
HCT: 31.8 % — ABNORMAL LOW (ref 36.0–46.0)
HEMOGLOBIN: 9.8 g/dL — AB (ref 12.0–15.0)
MCH: 20.9 pg — ABNORMAL LOW (ref 26.0–34.0)
MCHC: 30.8 g/dL (ref 30.0–36.0)
MCV: 67.7 fL — ABNORMAL LOW (ref 78.0–100.0)
MPV: 9.7 fL (ref 8.6–12.4)
PLATELETS: 224 10*3/uL (ref 150–400)
RBC: 4.7 MIL/uL (ref 3.87–5.11)
RDW: 17.2 % — ABNORMAL HIGH (ref 11.5–15.5)
WBC: 5.9 10*3/uL (ref 4.0–10.5)

## 2014-09-02 LAB — LIPID PANEL
CHOLESTEROL: 147 mg/dL (ref 0–200)
HDL: 48 mg/dL (ref >=46)
LDL Cholesterol: 85 mg/dL (ref 0–99)
Total CHOL/HDL Ratio: 3.1 Ratio
Triglycerides: 71 mg/dL (ref <150)
VLDL: 14 mg/dL (ref 0–40)

## 2014-09-02 LAB — POCT URINALYSIS DIPSTICK
BILIRUBIN UA: NEGATIVE
Glucose, UA: NEGATIVE
KETONES UA: NEGATIVE
Leukocytes, UA: NEGATIVE
Nitrite, UA: POSITIVE
PH UA: 6
Protein, UA: NEGATIVE
RBC UA: NEGATIVE
SPEC GRAV UA: 1.025
Urobilinogen, UA: NEGATIVE

## 2014-09-02 LAB — TSH: TSH: 0.707 u[IU]/mL (ref 0.350–4.500)

## 2014-09-02 LAB — POCT URINE PREGNANCY: PREG TEST UR: NEGATIVE

## 2014-09-02 MED ORDER — NORETHINDRONE ACETATE 5 MG PO TABS: 5.0000 mg | ORAL_TABLET | Freq: Every day | ORAL | Status: DC

## 2014-09-02 NOTE — Progress Notes (Signed)
Subjective:   HPI  Joan Joyce is a 34 y.o. female who presents for a complete physical.  Preventative care: Last ophthalmology visit:never Last dental visit:sees regularly Last colonoscopy: never Last mammogram: never Last gynecological exam: 2 years ago Last EKG: never Last labs: over a year ago Prior vaccinations: TD or Tdap: needs one today Influenza:declines  Concerns: She notes periods aren't has heavy as last year.  Periods are regular now.  No OCPs currently.  Hx/o 3 pregnancies, 3 live births.  Has spot on her chest wants looked at.    curious about prescription for hormone to prolong period.  Planning to go on vacation soon, wants to offset period for vacation.   Used this last year from here and it worked well.   Reviewed their medical, surgical, family, social, medication, and allergy history and updated chart as appropriate.  Past Medical History  Diagnosis Date  . UTI (lower urinary tract infection)   . Chlamydia   . BV (bacterial vaginosis)   . Obesity   . Seasonal allergic rhinitis   . Low HDL (under 40) 2015    Past Surgical History  Procedure Laterality Date  . Tubal ligation    . Cesarean section      x3; 2002,2005,2010  . Colposcopy  2010    Dr. Aldona Bar    History   Social History  . Marital Status: Single    Spouse Name: N/A  . Number of Children: N/A  . Years of Education: N/A   Occupational History  . Not on file.   Social History Main Topics  . Smoking status: Never Smoker   . Smokeless tobacco: Not on file  . Alcohol Use: No  . Drug Use: No  . Sexual Activity: Yes    Birth Control/ Protection: Surgical   Other Topics Concern  . Not on file   Social History Narrative   Works as Production designer, theatre/television/film at [employer], no exercise, lives with boyfriend and 3 children; 14yo, 11yo, and 5yo.  As of 08/2014    Family History  Problem Relation Age of Onset  . Cancer Mother     ovarian  . Hypertension Mother   . Diabetes Mother   . COPD  Mother   . Heart disease Mother     arrhythmia  . Cancer Father     lung, throat, smoker  . Heart disease Maternal Grandmother   . Stroke Neg Hx   . Hyperlipidemia Neg Hx   . Gallbladder disease Sister      Current outpatient prescriptions:  .  cephALEXin (KEFLEX) 500 MG capsule, Take 1 capsule (500 mg total) by mouth 3 (three) times daily., Disp: 30 capsule, Rfl: 0 .  norethindrone (AYGESTIN) 5 MG tablet, Take 1 tablet (5 mg total) by mouth daily., Disp: 20 tablet, Rfl: 0  No Known Allergies   Review of Systems Constitutional: -fever, -chills, -sweats, -unexpected weight change, -decreased appetite, -fatigue Allergy: -sneezing, -itching, -congestion Dermatology: -changing moles, --rash, -lumps ENT: -runny nose, -ear pain, -sore throat, -hoarseness, -sinus pain, -teeth pain, - ringing in ears, -hearing loss, -nosebleeds Cardiology: -chest pain, -palpitations, -swelling, -difficulty breathing when lying flat, -waking up short of breath Respiratory: -cough, -shortness of breath, -difficulty breathing with exercise or exertion, -wheezing, -coughing up blood Gastroenterology: -abdominal pain, -nausea, -vomiting, -diarrhea, -constipation, -blood in stool, -changes in bowel movement, -difficulty swallowing or eating Hematology: -bleeding, -bruising  Musculoskeletal: -joint aches, -muscle aches, -joint swelling, -back pain, -neck pain, -cramping, -changes in gait Ophthalmology: denies vision changes,  eye redness, itching, discharge Urology: -burning with urination, -difficulty urinating, -blood in urine, -urinary frequency, -urgency, -incontinence Neurology: -headache, -weakness, -tingling, -numbness, -memory loss, -falls, -dizziness Psychology: -depressed mood, -agitation, -sleep problems     Objective:   Physical Exam  BP 100/70 mmHg  Pulse 88  Temp(Src) 98.4 F (36.9 C) (Oral)  Ht 5' 5.5" (1.664 m)  Wt 207 lb (93.895 kg)  BMI 33.91 kg/m2  LMP 08/12/2014  General appearance:  alert, no distress, WD/WN, AA female  Skin: Right cheek with slightly raised 3 mm x 2 mm brown uniform macule well-defined, several skin tags on neck bilaterally, other scattered benign-appearing macules throughout, left lower buttock with darker brown coloration from prior skin infection, nonspecific, no worrisome lesions HEENT: normocephalic, conjunctiva/corneas normal, sclerae anicteric, PERRLA, EOMi, nares patent, no discharge or erythema, pharynx normal Oral cavity: MMM, tongue normal, teeth with moderate plaque, otherwise in good repair Neck: supple, no lymphadenopathy, no thyromegaly, no masses, normal ROM, no bruits Chest: non tender, normal shape and expansion Heart: RRR, normal S1, S2, no murmurs Lungs: CTA bilaterally, no wheezes, rhonchi, or rales Abdomen: +bs, soft, non tender, non distended, no masses, no hepatomegaly, no splenomegaly, no bruits Back: non tender, normal ROM, no scoliosis Musculoskeletal: upper extremities non tender, no obvious deformity, normal ROM throughout, lower extremities non tender, no obvious deformity, normal ROM throughout Extremities: no edema, no cyanosis, no clubbing Pulses: 2+ symmetric, upper and lower extremities, normal cap refill Neurological: alert, oriented x 3, CN2-12 intact, strength normal upper extremities and lower extremities, sensation normal throughout, DTRs 2+ throughout, no cerebellar signs, gait normal Psychiatric: normal affect, behavior normal, pleasant  Breast: right breast at 11 oclock deep with slight cystic tissue that is tender, othewrise nontender, no masses or lumps, no skin changes, no nipple discharge or inversion, no axillary lymphadenopathy Gyn: Normal external genitalia without lesions, vagina with normal mucosa, cervix without lesions, no cervical motion tenderness, no abnormal vaginal discharge.  Uterus and adnexa not enlarged, nontender, no masses.  Pap performed.  Exam chaperoned by nurse. Rectal:  deferred     Assessment and Plan :    Encounter Diagnoses  Name Primary?  . Adult general medical exam Yes  . Need for Tdap vaccination   . Iron deficiency anemia   . Abnormal vision screen   . Low serum HDL   . Screening for cervical cancer   . Screen for STD (sexually transmitted disease)   . Skin infection     Physical exam - discussed healthy lifestyle, diet, exercise, preventative care, vaccinations, and addressed their concerns.  Handout given. Counseled on the Tdap (tetanus, diptheria, and acellular pertussis) vaccine.  Vaccine information sheet given. Tdap vaccine given after consent obtained. See your dentist yearly for routine dental care including hygiene visits twice yearly. recheck labs regarding hx/o anemia abnormal vision screen - advised visit to see eye doctor for formal eval Low HDL - discussed diet and exercise Labs today Skin findings nonspecific - advise daily moisturizing lotion.  If not improving let me know.  For next flare up try the keflex as it sounds like furuncle Follow-up pending labs

## 2014-09-03 ENCOUNTER — Encounter: Payer: Self-pay | Admitting: Medical

## 2014-09-03 LAB — HIV ANTIBODY (ROUTINE TESTING W REFLEX): HIV 1&2 Ab, 4th Generation: NONREACTIVE

## 2014-09-03 LAB — HEMOGLOBIN A1C
Hgb A1c MFr Bld: 5.8 % — ABNORMAL HIGH (ref <5.7)
MEAN PLASMA GLUCOSE: 120 mg/dL — AB (ref <117)

## 2014-09-03 LAB — RPR

## 2014-09-03 MED ORDER — CEPHALEXIN 500 MG PO CAPS: 500.0000 mg | ORAL_CAPSULE | Freq: Three times a day (TID) | ORAL | Status: DC

## 2014-09-04 LAB — CYTOLOGY - PAP

## 2014-09-05 LAB — URINE CULTURE: Colony Count: >=100000

## 2014-11-05 ENCOUNTER — Encounter: Payer: Self-pay | Admitting: Family Medicine

## 2014-11-05 ENCOUNTER — Ambulatory Visit (INDEPENDENT_AMBULATORY_CARE_PROVIDER_SITE_OTHER): Payer: BLUE CROSS/BLUE SHIELD | Admitting: Family Medicine

## 2014-11-05 VITALS — BP 118/72 | HR 80 | Ht 64.25 in | Wt 209.2 lb

## 2014-11-05 DIAGNOSIS — B373 Candidiasis of vulva and vagina: Secondary | ICD-10-CM | POA: Diagnosis not present

## 2014-11-05 DIAGNOSIS — Z23 Encounter for immunization: Secondary | ICD-10-CM | POA: Diagnosis not present

## 2014-11-05 DIAGNOSIS — B3731 Acute candidiasis of vulva and vagina: Secondary | ICD-10-CM

## 2014-11-05 LAB — POCT WET PREP (WET MOUNT)

## 2014-11-05 MED ORDER — FLUCONAZOLE 150 MG PO TABS: 150.0000 mg | ORAL_TABLET | Freq: Once | ORAL | Status: DC

## 2014-11-05 NOTE — Progress Notes (Signed)
Chief Complaint  Patient presents with  . Yeast infect    x 2days white, itchuy discharge. Mentions that she had bladder infection in June and took keflex but never returned for recheck.     She has had a vaginal discharge for 2 days.  It is white, and is having itching.  No odor, pelvic pain, fevers.  No skin irritation or rash.  Itching is internal, not external. She went to [place] and spent much of the day in wet clothing.  No new soaps or products. She is in a monogamous relationship, denies concern for STD.   She denies any urinary symptoms--no urgency/frequency/dysuria/odor/hematuria. She was treated with Keflex 2 months ago for a skin infection as well as they found E.coli on a culture done after routine urine dip at her physical was abnormal.  She was not symptomatic at that time either.  She denies any current skin concerns.  Last time she was here she had been telling provider about "recurrent spider bites in the same exact spot" on her lower back/buttock, that periodically recurs in the same location.  Currently not having any problems.   PMH, PSH, SH reviewed. Meds: none No Known Allergies   ROS: no fevers, chills, urinary complaints, rashes, bleeding, bruising, URI symptoms, cough, headaches, dizziness, chest pain, abdominal pain or other concerns.  PHYSICAL EXAM: BP 118/72 mmHg  Pulse 80  Ht 5' 4.25" (1.632 m)  Wt 209 lb 3.2 oz (94.892 kg)  BMI 35.63 kg/m2  LMP 10/13/2014  Well developed, pleasant female in no distress Neck: no lymphadenopathy or mass Back: no spinal or CVA tenderness Heart: regular rate and rhythm Lungs: clear Abdomen: soft, nontender GU: normal external genitalia without erythema or rash There is creamy white discharge, some part of which appear quite thick. No odor Skin: no rash  Wet prep--many squams, no clue cells or bacteria KOH: +hyphae  ASSESSMENT/PLAN:   Yeast vaginitis - Plan: POCT Wet Prep ([place]et [place]), fluconazole (DIFLUCAN) 150  MG tablet  Need for prophylactic vaccination and inoculation against influenza - Plan: Flu Vaccine QUAD 36+ mos PF IM (Fluarix & Fluzone Quad PF)

## 2014-11-05 NOTE — Patient Instructions (Signed)

## 2016-01-19 DIAGNOSIS — M5412 Radiculopathy, cervical region: Secondary | ICD-10-CM | POA: Diagnosis not present

## 2016-01-19 DIAGNOSIS — M542 Cervicalgia: Secondary | ICD-10-CM | POA: Diagnosis not present

## 2016-01-19 DIAGNOSIS — G5621 Lesion of ulnar nerve, right upper limb: Secondary | ICD-10-CM | POA: Diagnosis not present

## 2016-01-19 DIAGNOSIS — G5622 Lesion of ulnar nerve, left upper limb: Secondary | ICD-10-CM | POA: Diagnosis not present

## 2016-01-19 DIAGNOSIS — G5603 Carpal tunnel syndrome, bilateral upper limbs: Secondary | ICD-10-CM | POA: Diagnosis not present

## 2016-02-01 DIAGNOSIS — G5601 Carpal tunnel syndrome, right upper limb: Secondary | ICD-10-CM | POA: Diagnosis not present

## 2016-02-01 DIAGNOSIS — G5603 Carpal tunnel syndrome, bilateral upper limbs: Secondary | ICD-10-CM | POA: Diagnosis not present

## 2016-02-01 DIAGNOSIS — M5412 Radiculopathy, cervical region: Secondary | ICD-10-CM | POA: Diagnosis not present

## 2016-04-18 ENCOUNTER — Ambulatory Visit: Payer: BLUE CROSS/BLUE SHIELD | Admitting: Medical

## 2016-05-12 ENCOUNTER — Ambulatory Visit (INDEPENDENT_AMBULATORY_CARE_PROVIDER_SITE_OTHER): Payer: BLUE CROSS/BLUE SHIELD | Admitting: Medical

## 2016-05-12 ENCOUNTER — Other Ambulatory Visit (HOSPITAL_COMMUNITY)
Admission: RE | Admit: 2016-05-12 | Discharge: 2016-05-12 | Disposition: A | Discharge status: Home / Self Care | Payer: BLUE CROSS/BLUE SHIELD | Source: Ambulatory Visit | Attending: Medical | Admitting: Medical

## 2016-05-12 ENCOUNTER — Encounter: Payer: Self-pay | Admitting: Medical

## 2016-05-12 VITALS — BP 144/88 | HR 77 | Wt 218.0 lb

## 2016-05-12 DIAGNOSIS — Z01411 Encounter for gynecological examination (general) (routine) with abnormal findings: Secondary | ICD-10-CM | POA: Diagnosis not present

## 2016-05-12 DIAGNOSIS — R638 Other symptoms and signs concerning food and fluid intake: Secondary | ICD-10-CM

## 2016-05-12 DIAGNOSIS — N898 Other specified noninflammatory disorders of vagina: Secondary | ICD-10-CM

## 2016-05-12 DIAGNOSIS — Z01419 Encounter for gynecological examination (general) (routine) without abnormal findings: Secondary | ICD-10-CM | POA: Diagnosis not present

## 2016-05-12 DIAGNOSIS — Z113 Encounter for screening for infections with a predominantly sexual mode of transmission: Secondary | ICD-10-CM | POA: Diagnosis not present

## 2016-05-12 DIAGNOSIS — D509 Iron deficiency anemia, unspecified: Secondary | ICD-10-CM

## 2016-05-12 DIAGNOSIS — N760 Acute vaginitis: Secondary | ICD-10-CM

## 2016-05-12 DIAGNOSIS — Z1151 Encounter for screening for human papillomavirus (HPV): Secondary | ICD-10-CM | POA: Insufficient documentation

## 2016-05-12 DIAGNOSIS — Z124 Encounter for screening for malignant neoplasm of cervix: Secondary | ICD-10-CM

## 2016-05-12 LAB — CBC WITH DIFFERENTIAL/PLATELET
BASOS PCT: 0 %
Basophils Absolute: 0 cells/uL (ref 0–200)
EOS ABS: 243 {cells}/uL (ref 15–500)
EOS PCT: 3 %
HCT: 29.5 % — ABNORMAL LOW (ref 35.0–45.0)
Hemoglobin: 9 g/dL — ABNORMAL LOW (ref 11.7–15.5)
LYMPHS PCT: 30 %
Lymphs Abs: 2430 cells/uL (ref 850–3900)
MCH: 20 pg — ABNORMAL LOW (ref 27.0–33.0)
MCHC: 30.5 g/dL — ABNORMAL LOW (ref 32.0–36.0)
MCV: 65.6 fL — AB (ref 80.0–100.0)
MONOS PCT: 8 %
MPV: 9.3 fL (ref 7.5–12.5)
Monocytes Absolute: 648 cells/uL (ref 200–950)
Neutro Abs: 4779 cells/uL (ref 1500–7800)
Neutrophils Relative %: 59 %
PLATELETS: 256 10*3/uL (ref 140–400)
RBC: 4.5 MIL/uL (ref 3.80–5.10)
RDW: 18 % — AB (ref 11.0–15.0)
WBC: 8.1 10*3/uL (ref 4.0–10.5)

## 2016-05-12 LAB — POCT WET PREP (WET MOUNT)

## 2016-05-12 LAB — POCT URINALYSIS DIPSTICK
BILIRUBIN UA: NEGATIVE
Glucose, UA: NEGATIVE
Ketones, UA: NEGATIVE
LEUKOCYTES UA: NEGATIVE
NITRITE UA: NEGATIVE
PH UA: 6
PROTEIN UA: NEGATIVE
RBC UA: NEGATIVE
Spec Grav, UA: 1.02
UROBILINOGEN UA: NEGATIVE

## 2016-05-12 MED ORDER — FLUCONAZOLE 150 MG PO TABS: ORAL_TABLET | ORAL | 1 refills | Status: DC

## 2016-05-12 MED ORDER — METRONIDAZOLE 500 MG PO TABS: 500.0000 mg | ORAL_TABLET | Freq: Three times a day (TID) | ORAL | 0 refills | Status: DC

## 2016-05-12 NOTE — Progress Notes (Signed)
Subjective: Chief Complaint  Patient presents with  . possible yeast    vagianal irration , itching, some discharge    Here for vaginal irritation x a few months.  Not all the time.  Bothers her when menstrual period is about to come on, but afterwards no irritation.  Is with same partner of 12 years.  Getting some whitish discharge, no odor.   Discharge not worse with sexual activity.   Only seems to get discharge around time of period.  No recent antibiotics.   No douching.   Mostly shower.  Uses consistent hygiene products.  Does eat sugar foods, fast food, not always healthy.   Wears variety of cotton and blend underwear.  Using nothing for symptoms.  Periods regular.  No birth control but has had tubes tied.   Periods are not too heavy.   Can't recall when last vaginal infection was.   No concern for STD but would like to check for this.  When having period uses pads primarily.  Chews a lot of ice, does have some urinary frequency.   No increased thirst, no urine odor, no burning with urination.    Hx/o anemia, not taking iron currently. No blood in stool.  No other aggravating or relieving factors. No other complaint.   Past Medical History:  Diagnosis Date  . BV (bacterial vaginosis)   . Chlamydia   . Low HDL (under 40) 2015  . Obesity   . Seasonal allergic rhinitis   . UTI (lower urinary tract infection)    Current Outpatient Prescriptions on File Prior to Visit  Medication Sig Dispense Refill  . fluconazole (DIFLUCAN) 150 MG tablet Take 1 tablet (150 mg total) by mouth once. (Patient not taking: Reported on 05/12/2016) 1 tablet 0   No current facility-administered medications on file prior to visit.    ROS as in subjective  Objective: BP (!) 144/88   Pulse 77   Wt 218 lb (98.9 kg)   SpO2 99%   BMI 37.13 kg/m   Wt Readings from Last 3 Encounters:  05/12/16 218 lb (98.9 kg)  11/05/14 209 lb 3.2 oz (94.9 kg)  09/02/14 207 lb (93.9 kg)   BP Readings from Last 3  Encounters:  05/12/16 (!) 144/88  11/05/14 118/72  09/02/14 100/70   Gen: wd, wn, nad Gyn: Normal external genitalia without lesions, vagina with normal mucosa, cervix without lesions, no cervical motion tenderness, slight creamy abnormal vaginal discharge.  Uterus and adnexa not enlarged, nontender, no masses.  Pap performed.  Exam chaperoned by nurse. Rectal: anus normal appearing    Assessment: Encounter Diagnoses  Name Primary?  . Vaginal discharge Yes  . Iron deficiency anemia, unspecified iron deficiency anemia type   . Abnormal craving   . Screen for STD (sexually transmitted disease)   . Acute vaginitis   . Screening for cervical cancer     Plan: Labs today.  Wet prep with yeast and clue cells.  Will treat for yeast and BV.  F/u pending labs.    CBC recheck on anemia.   Galina was seen today for possible yeast.  Diagnoses and all orders for this visit:  Vaginal discharge -     POCT urinalysis dipstick -     POCT Wet Prep [facility]) -     Cytology - PAP  Iron deficiency anemia, unspecified iron deficiency anemia type -     CBC with Differential/Platelet  Abnormal craving -     CBC with Differential/Platelet  Screen for STD (sexually transmitted disease) -     POCT urinalysis dipstick -     POCT Wet Prep ([facility]) -     HIV antibody -     RPR -     GC/Chlamydia Probe Amp -     Cytology - PAP  Acute vaginitis -     POCT urinalysis dipstick -     POCT Wet Prep [facility]) -     Cytology - PAP  Screening for cervical cancer -     Cytology - PAP  Other orders -     fluconazole (DIFLUCAN) 150 MG tablet; 1 tablet now, can repeat in a week -     metroNIDAZOLE (FLAGYL) 500 MG tablet; Take 1 tablet (500 mg total) by mouth 3 (three) times daily.

## 2016-05-13 LAB — GC/CHLAMYDIA PROBE AMP
CT PROBE, AMP APTIMA: NOT DETECTED
GC PROBE AMP APTIMA: NOT DETECTED

## 2016-05-13 LAB — RPR

## 2016-05-13 LAB — HIV ANTIBODY (ROUTINE TESTING W REFLEX): HIV: NONREACTIVE

## 2016-05-15 ENCOUNTER — Other Ambulatory Visit: Payer: Self-pay | Admitting: Medical

## 2016-05-15 MED ORDER — FERROUS GLUCONATE 324 (38 FE) MG PO TABS: 324.0000 mg | ORAL_TABLET | Freq: Two times a day (BID) | ORAL | 2 refills | Status: DC

## 2016-05-18 LAB — CYTOLOGY - PAP
ADEQUACY: ABSENT — AB
Chlamydia: NEGATIVE
DIAGNOSIS: UNDETERMINED — AB
HPV (WINDOPATH): NOT DETECTED
Neisseria Gonorrhea: NEGATIVE

## 2016-05-29 ENCOUNTER — Telehealth: Payer: Self-pay

## 2016-05-29 NOTE — Telephone Encounter (Signed)
Have her come in for physical and we can discussed pill options.   Regarding pap, result interpretation are based on current [employer] guidelines.    For her age and pap results, we gave the current recommendation.  We can review this further as well when she comes for physical.  Guidelines were different in the past and they change from time to time based on current evidence   The good news is that the results do not suggest cancer cells

## 2016-05-29 NOTE — Telephone Encounter (Signed)
Pt had a question  About her pap being abnormal, she has had this in the past , and had to have cervix  Frozen , and she wants to know if she can a pill prolong her periods she has had this in the past.

## 2016-05-30 NOTE — Telephone Encounter (Signed)
Called and spoke with pt she made an appt , but I need to change it. Call pt and l/m to call us back to move her appt to another day and time.

## 2016-05-31 NOTE — Telephone Encounter (Signed)
Called and l/m for pt call us back.  

## 2016-06-06 ENCOUNTER — Ambulatory Visit (INDEPENDENT_AMBULATORY_CARE_PROVIDER_SITE_OTHER): Payer: BLUE CROSS/BLUE SHIELD | Admitting: Medical

## 2016-06-06 ENCOUNTER — Encounter: Payer: Self-pay | Admitting: Medical

## 2016-06-06 VITALS — BP 126/80 | HR 91 | Wt 215.8 lb

## 2016-06-06 DIAGNOSIS — Z7189 Other specified counseling: Secondary | ICD-10-CM | POA: Diagnosis not present

## 2016-06-06 DIAGNOSIS — D509 Iron deficiency anemia, unspecified: Secondary | ICD-10-CM

## 2016-06-06 DIAGNOSIS — R87610 Atypical squamous cells of undetermined significance on cytologic smear of cervix (ASC-US): Secondary | ICD-10-CM | POA: Insufficient documentation

## 2016-06-06 DIAGNOSIS — Z0000 Encounter for general adult medical examination without abnormal findings: Secondary | ICD-10-CM

## 2016-06-06 DIAGNOSIS — R748 Abnormal levels of other serum enzymes: Secondary | ICD-10-CM

## 2016-06-06 DIAGNOSIS — Z7185 Encounter for immunization safety counseling: Secondary | ICD-10-CM | POA: Insufficient documentation

## 2016-06-06 DIAGNOSIS — D649 Anemia, unspecified: Secondary | ICD-10-CM | POA: Insufficient documentation

## 2016-06-06 LAB — POCT URINALYSIS DIPSTICK
Bilirubin, UA: NEGATIVE
Glucose, UA: NEGATIVE
KETONES UA: NEGATIVE
Leukocytes, UA: NEGATIVE
Nitrite, UA: NEGATIVE
PROTEIN UA: NEGATIVE
RBC UA: NEGATIVE
Spec Grav, UA: 1.02 (ref 1.030–1.035)
UROBILINOGEN UA: NEGATIVE (ref ?–2.0)
pH, UA: 7 (ref 5.0–8.0)

## 2016-06-06 MED ORDER — NORETHINDRONE ACETATE 5 MG PO TABS: 5.0000 mg | ORAL_TABLET | Freq: Every day | ORAL | 0 refills | Status: DC

## 2016-06-06 NOTE — Telephone Encounter (Signed)
Pt came in for her any appt.

## 2016-06-06 NOTE — Progress Notes (Signed)
Subjective:   HPI  Joan Joyce is a 36 y.o. female who presents for Chief Complaint  Patient presents with  . Annual Exam    physical  no pap, discuss results of pap, having heel pain after setting for long periods of time     Medical care team includes: Ernst Breach, PA-C here for primary care Dentist Eye doctor   Concerns: Only concern is to refill medication, Aygestin 5mg  she has used prn in the past to hold off period.  She is going on vacation July, wants to be able to time her periods . In the past she will take this a few days before the period is to start and a few days more, roughly 3-5 days at a time.  Then the bleeding will delay, but her next period usually starts on time the next month.  Hx/o iron deficiency anemia, has recently started back on iron BID at my request.   She has stool cards she will be turning in.  Reviewed their medical, surgical, family, social, medication, and allergy history and updated chart as appropriate.  Past Medical History:  Diagnosis Date  . BV (bacterial vaginosis)   . Chlamydia   . Low HDL (under 40) 2015  . Obesity   . Obesity   . Seasonal allergic rhinitis   . UTI (lower urinary tract infection)     Past Surgical History:  Procedure Laterality Date  . CESAREAN SECTION     x3; 2002,2005,2010  . COLPOSCOPY  2010   Dr. Aldona Bar  . TUBAL LIGATION      Social History   Social History  . Marital status: Single    Spouse name: N/A  . Number of children: N/A  . Years of education: N/A   Occupational History  . cafeteria  [employer]   Social History Main Topics  . Smoking status: Never Smoker  . Smokeless tobacco: Never Used  . Alcohol use No  . Drug use: No  . Sexual activity: Yes    Birth control/ protection: Surgical   Other Topics Concern  . 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  As of 05/2016    Family History  Problem Relation Age of Onset  . Cancer Mother     ovarian  . Hypertension Mother   . Diabetes Mother   . COPD Mother   . Heart disease Mother     arrhythmia  . Cancer Father     lung, throat, smoker  . Heart disease Maternal Grandmother   . Gallbladder disease Sister   . Stroke Neg Hx   . Hyperlipidemia Neg Hx      Current Outpatient Prescriptions:  .  ferrous gluconate (FERGON) 324 MG tablet, Take 1 tablet (324 mg total) by mouth 2 (two) times daily with a meal., Disp: 60 tablet, Rfl: 2 .  norethindrone (AYGESTIN) 5 MG tablet, Take 1 tablet (5 mg total) by mouth daily., Disp: 20 tablet, Rfl: 0  No Known Allergies     Review of Systems Constitutional: -fever, -chills, -sweats, -unexpected weight change, -decreased appetite, -fatigue Allergy: -sneezing, -itching, -congestion Dermatology: -changing moles, --rash, -lumps ENT: -runny nose, -ear pain, -sore throat, -hoarseness, -sinus pain, -teeth pain, - ringing in ears, -hearing loss, -nosebleeds Cardiology: -chest pain, -palpitations, -swelling, -difficulty breathing when lying flat, -waking up short of breath Respiratory: -cough, -  shortness of breath, -difficulty breathing with exercise or exertion, -wheezing, -coughing up blood Gastroenterology: -abdominal pain, -nausea, -vomiting, -diarrhea, -constipation, -blood in stool, -changes in bowel movement, -difficulty swallowing or eating Hematology: -bleeding, -bruising  Musculoskeletal: -joint aches, -muscle aches, -joint swelling, -back pain, -neck pain, -cramping, -changes in gait Ophthalmology: denies vision changes, eye redness, itching, discharge Urology: -burning with urination, -difficulty urinating, -blood in urine, -urinary frequency, -urgency, -incontinence Neurology: -headache, -weakness, -tingling, -numbness, -memory loss, -falls, -dizziness Psychology: -depressed mood, -agitation, -sleep problems Breast/gyn: -breast  tenderness, -discharge, -lumps, -vaginal discharge,- irregular periods, -heavy periods    Objective:   BP 126/80   Pulse 91   Wt 215 lb 12.8 oz (97.9 kg)   SpO2 99%   BMI 36.75 kg/m   General appearance: alert, no distress, WD/WN, African American female Skin: Right cheek with slightly raised 3 mm x 2 mm brown uniform macule well-defined, several skin tags on neck bilaterally, right lower flank with unchanged 3mm x 2mm brown flat macule, other scattered benign-appearing macules throughout, no worrisome lesions HEENT: normocephalic, conjunctiva/corneas normal, sclerae anicteric, PERRLA, EOMi, nares patent, no discharge or erythema, pharynx normal Oral cavity: MMM, tongue normal, teeth normal Neck: supple, no lymphadenopathy, no thyromegaly, no masses, normal ROM, no bruits Chest: non tender, normal shape and expansion Heart: RRR, normal S1, S2, no murmurs Lungs: CTA bilaterally, no wheezes, rhonchi, or rales Abdomen: +bs, soft, non tender, non distended, no masses, no hepatomegaly, no splenomegaly, no bruits Back: non tender, normal ROM, no scoliosis Musculoskeletal: upper extremities non tender, no obvious deformity, normal ROM throughout, lower extremities non tender, no obvious deformity, normal ROM throughout Extremities: no edema, no cyanosis, no clubbing Pulses: 2+ symmetric, upper and lower extremities, normal cap refill Neurological: alert, oriented x 3, CN2-12 intact, strength normal upper extremities and lower extremities, sensation normal throughout, DTRs 2+ throughout, no cerebellar signs, gait normal Psychiatric: normal affect, behavior normal, pleasant  Rectal:deferred Breast/gyn/rectal - deferred since we just recently examined her  Assessment and Plan :    Encounter Diagnoses  Name Primary?  . Encounter for health maintenance examination in adult Yes  . Anemia, unspecified type   . Iron deficiency anemia, unspecified iron deficiency anemia type   . Low serum HDL    . ASCUS of cervix with negative high risk HPV   . Vaccine counseling     Physical exam - discussed and counseled on healthy lifestyle, diet, exercise, preventative care, vaccinations, sick and well care, proper use of emergency dept and after hours care, and addressed their concerns.    Health screening: See your eye doctor yearly for routine vision care. See your dentist yearly for routine dental care including hygiene visits twice yearly.  Cancer screening Discussed and advised monthly self breast exams Discussed pap smear recommendations.   Pap smear from last visit reviewed  Vaccinations: Counseled on the following vaccines:  influenza  Acute issues discussed: Use Aygestin prn in July for period interruption  Separate significant chronic issues discussed: Anemia - f/u pending labs ASCUS - repeat pap 1 year  Roni was seen today for annual exam.  Diagnoses and all orders for this visit:  Encounter for health maintenance examination in adult -     Comprehensive metabolic panel; Future -     CBC with Differential/Platelet; Future -     Hemoglobin A1c; Future -     Iron and TIBC; Future -     Urinalysis Dipstick  Anemia, unspecified type -     Comprehensive metabolic panel;  Future -     CBC with Differential/Platelet; Future -     Iron and TIBC; Future  Iron deficiency anemia, unspecified iron deficiency anemia type -     Comprehensive metabolic panel; Future -     CBC with Differential/Platelet; Future -     Iron and TIBC; Future  Low serum HDL  ASCUS of cervix with negative high risk HPV  Vaccine counseling  Other orders -     norethindrone (AYGESTIN) 5 MG tablet; Take 1 tablet (5 mg total) by mouth daily.   Follow-up pending labs, yearly for physical

## 2016-06-06 NOTE — Patient Instructions (Signed)
Return in 6-8 weeks for labs Return the stool cards to check for blood Begin towel stretch and tennis ball massage daily for plantar fascitis Consider 90 degree foot splint online for plantar fascitis Plan to repeat pap in a year   Plantar Fasciitis Plantar fasciitis is a painful foot condition that affects the heel. It occurs when the band of tissue that connects the toes to the heel bone (plantar fascia) becomes irritated. This can happen after exercising too much or doing other repetitive activities (overuse injury). The pain from plantar fasciitis can range from mild irritation to severe pain that makes it difficult for you to walk or move. The pain is usually worse in the morning or after you have been sitting or lying down for a while. What are the causes? This condition may be caused by:  Standing for long periods of time.  Wearing shoes that do not fit.  Doing high-impact activities, including running, aerobics, and ballet.  Being overweight.  Having an abnormal way of walking (gait).  Having tight calf muscles.  Having high arches in your feet.  Starting a new athletic activity. What are the signs or symptoms? The main symptom of this condition is heel pain. Other symptoms include:  Pain that gets worse after activity or exercise.  Pain that is worse in the morning or after resting.  Pain that goes away after you walk for a few minutes. How is this diagnosed? This condition may be diagnosed based on your signs and symptoms. Your health care provider will also do a physical exam to check for:  A tender area on the bottom of your foot.  A high arch in your foot.  Pain when you move your foot.  Difficulty moving your foot. You may also need to have imaging studies to confirm the diagnosis. These can include:  X-rays.  Ultrasound.  MRI. How is this treated? Treatment for plantar fasciitis depends on the severity of the condition. Your treatment may  include:  Rest, ice, and over-the-counter pain medicines to manage your pain.  Exercises to stretch your calves and your plantar fascia.  A splint that holds your foot in a stretched, upward position while you sleep (night splint).  Physical therapy to relieve symptoms and prevent problems in the future.  Cortisone injections to relieve severe pain.  Extracorporeal shock wave therapy (ESWT) to stimulate damaged plantar fascia with electrical impulses. It is often used as a last resort before surgery.  Surgery, if other treatments have not worked after 12 months. Follow these instructions at home:  Take medicines only as directed by your health care provider.  Avoid activities that cause pain.  Roll the bottom of your foot over a bag of ice or a bottle of cold water. Do this for 20 minutes, 3-4 times a day.  Perform simple stretches as directed by your health care provider.  Try wearing athletic shoes with air-sole or gel-sole cushions or soft shoe inserts.  Wear a night splint while sleeping, if directed by your health care provider.  Keep all follow-up appointments with your health care provider. How is this prevented?  Do not perform exercises or activities that cause heel pain.  Consider finding low-impact activities if you continue to have problems.  Lose weight if you need to. The best way to prevent plantar fasciitis is to avoid the activities that aggravate your plantar fascia. Contact a health care provider if:  Your symptoms do not go away after treatment with home care  measures.  Your pain gets worse.  Your pain affects your ability to move or do your daily activities. This information is not intended to replace advice given to you by your health care provider. Make sure you discuss any questions you have with your health care provider. Document Released: 11/22/2000 Document Revised: 08/02/2015 Document Reviewed: 01/07/2014 Elsevier Interactive Patient  Education  2017 [employer].

## 2016-07-15 ENCOUNTER — Other Ambulatory Visit: Payer: Self-pay | Admitting: Medical

## 2016-07-18 ENCOUNTER — Other Ambulatory Visit: Payer: BLUE CROSS/BLUE SHIELD

## 2016-07-20 ENCOUNTER — Other Ambulatory Visit: Payer: BLUE CROSS/BLUE SHIELD

## 2016-08-02 ENCOUNTER — Telehealth: Payer: Self-pay | Admitting: Family Medicine

## 2016-08-02 NOTE — Telephone Encounter (Signed)
[facility] req refill of Ferrous Gluconate 324 mg

## 2016-08-03 NOTE — Telephone Encounter (Signed)
pls send the medication refill but she needs to come back for nurse visit to check iron and TIBC, CBC, and return stool cards x 3 given the anemia

## 2016-08-04 ENCOUNTER — Other Ambulatory Visit: Payer: Self-pay

## 2016-08-04 MED ORDER — FERROUS GLUCONATE 324 (38 FE) MG PO TABS: 324.0000 mg | ORAL_TABLET | Freq: Two times a day (BID) | ORAL | 2 refills | Status: DC

## 2016-08-04 NOTE — Telephone Encounter (Signed)
Called and l/m for pt call back to make an appt for nurse.

## 2016-08-09 NOTE — Telephone Encounter (Signed)
Called and l/m for pt call us

## 2016-10-23 DIAGNOSIS — Z23 Encounter for immunization: Secondary | ICD-10-CM | POA: Diagnosis not present

## 2016-11-30 ENCOUNTER — Ambulatory Visit (HOSPITAL_COMMUNITY)
Admission: EM | Admit: 2016-11-30 | Discharge: 2016-11-30 | Disposition: A | Discharge status: Home / Self Care | Payer: BLUE CROSS/BLUE SHIELD | Attending: Nurse Practitioner | Admitting: Nurse Practitioner

## 2016-11-30 ENCOUNTER — Encounter (HOSPITAL_COMMUNITY): Payer: Self-pay | Admitting: Emergency Medicine

## 2016-11-30 ENCOUNTER — Telehealth: Payer: Self-pay | Admitting: Medical

## 2016-11-30 DIAGNOSIS — S161XXA Strain of muscle, fascia and tendon at neck level, initial encounter: Secondary | ICD-10-CM | POA: Diagnosis not present

## 2016-11-30 MED ORDER — CYCLOBENZAPRINE HCL 10 MG PO TABS: 10.0000 mg | ORAL_TABLET | Freq: Two times a day (BID) | ORAL | 0 refills | Status: DC | PRN

## 2016-11-30 MED ORDER — IBUPROFEN 800 MG PO TABS: 800.0000 mg | ORAL_TABLET | Freq: Three times a day (TID) | ORAL | 0 refills | Status: DC

## 2016-11-30 MED ORDER — KETOROLAC TROMETHAMINE 60 MG/2ML IM SOLN
INTRAMUSCULAR | Status: AC
  Filled 2016-11-30: qty 2

## 2016-11-30 MED ORDER — KETOROLAC TROMETHAMINE 60 MG/2ML IM SOLN
60.0000 mg | Freq: Once | INTRAMUSCULAR | Status: AC
  Administered 2016-11-30: 60 mg via INTRAMUSCULAR

## 2016-11-30 NOTE — ED Triage Notes (Signed)
PT reports pain in left shoulder and left neck. No known injury or strain. Started yesterday.

## 2016-11-30 NOTE — Telephone Encounter (Signed)
Pt called stating needs appt, said pulled muscle at her job, not while working but while she was at her place of employment and just was bending over.  I explained we could not see her.  That she needed to report to her job and see where they wanted her to be seen.  She was not happy and hung up.  I called her back and explained the situation and why we couldn't see her and explained we did want her to seek treatment but we have to follow the appropriate protocol.  She seemed to understand.

## 2016-11-30 NOTE — ED Provider Notes (Signed)
[facility]    CSN: [id number] Arrival date & time: 11/30/16  1859     History   Chief Complaint Chief Complaint  Patient presents with  . Muscle Pain    HPI Joan Joyce is a 36 y.o. female.   Patient presents complaining of left neck pain with radiation to left shoulder and upper left arm. Onset of symptoms was abrupt starting 1 day ago. Mechanism of injury was a twisting injury. Pain is described as throbbing. Severity of symptoms moderate. Symptoms have been constant. Symptoms are aggravated by movement and alleviated by nothing. Patient denies any headache, nausea, vomiting, dizziness or extremity numbness/weakness.  Care prior to arrival consisted of rest and OTC ointment with no relief.  The following portions of the patient's history were reviewed and updated as appropriate: allergies, current medications, past family history, past medical history, past social history, past surgical history and problem list.      Past Medical History:  Diagnosis Date  . BV (bacterial vaginosis)   . Chlamydia   . Low HDL (under 40) 2015  . Obesity   . Obesity   . Seasonal allergic rhinitis   . UTI (lower urinary tract infection)     Patient Active Problem List   Diagnosis Date Noted  . Anemia 06/06/2016  . Encounter for health maintenance examination in adult 06/06/2016  . ASCUS of cervix with negative high risk HPV 06/06/2016  . Vaccine counseling 06/06/2016  . Low serum HDL 09/02/2014  . Iron deficiency anemia 05/13/2013    Past Surgical History:  Procedure Laterality Date  . CESAREAN SECTION     x3; 2002,2005,2010  . COLPOSCOPY  2010   Dr. Aldona Bar  . TUBAL LIGATION      OB History    No data available       Home Medications    Prior to Admission medications   Medication Sig Start Date End Date Taking? Authorizing Provider  ferrous gluconate (FERGON) 324 MG tablet Take 1 tablet (324 mg total) by mouth 2 (two) times daily with a meal. 08/04/16    Tysinger, Kermit Balo, PA-C  norethindrone (AYGESTIN) 5 MG tablet TAKE 1 TABLET (5 MG TOTAL) BY MOUTH DAILY. 07/17/16   Tysinger, Kermit Balo, PA-C    Family History Family History  Problem Relation Age of Onset  . Cancer Mother        ovarian  . Hypertension Mother   . Diabetes Mother   . COPD Mother   . Heart disease Mother        arrhythmia  . Cancer Father        lung, throat, smoker  . Heart disease Maternal Grandmother   . Gallbladder disease Sister   . Stroke Neg Hx   . Hyperlipidemia Neg Hx     Social History Social History  Substance Use Topics  . Smoking status: Never Smoker  . Smokeless tobacco: Never Used  . Alcohol use No     Allergies   Patient has no known allergies.   Review of Systems Review of Systems  Musculoskeletal: Positive for neck pain. Negative for neck stiffness.  All other systems reviewed and are negative.    Physical Exam Triage Vital Signs ED Triage Vitals [11/30/16 1934]  Enc Vitals Group     BP 132/71     Pulse Rate 84     Resp 16     Temp 97.8 F (36.6 C)     Temp Source Oral  SpO2 100 %     Weight 205 lb (93 kg)     Height  (1.6 m)     Head Circumference      Peak Flow      Pain Score 9     Pain Loc      Pain Edu?      Excl. in GC?    No data found.   Updated Vital Signs BP 132/71   Pulse 84   Temp 97.8 F (36.6 C) (Oral)   Resp 16   Ht  (1.6 m)   Wt 205 lb (93 kg)   LMP 11/24/2016   SpO2 100%   BMI 36.31 kg/m   Visual Acuity Right Eye Distance:   Left Eye Distance:   Bilateral Distance:    Right Eye Near:   Left Eye Near:    Bilateral Near:     Physical Exam  Constitutional: She is oriented to person, place, and time. She appears well-developed and well-nourished.  Neck: Normal range of motion. Neck supple.  Cardiovascular: Normal rate, regular rhythm and normal heart sounds.   Pulmonary/Chest: Effort normal and breath sounds normal.  Musculoskeletal: Normal range of motion.    Lymphadenopathy:    She has no cervical adenopathy.  Neurological: She is alert and oriented to person, place, and time.  Skin: Skin is warm and dry.  Psychiatric: She has a normal mood and affect.     UC Treatments / Results  Labs (all labs ordered are listed, but only abnormal results are displayed) Labs Reviewed - No data to display  EKG  EKG Interpretation None       Radiology No results found.  Procedures Procedures (including critical care time)  Medications Ordered in UC Medications - No data to display   Initial Impression / Assessment and Plan / UC Course  I have reviewed the triage vital signs and the nursing notes.  Pertinent labs & imaging results that were available during my care of the patient were reviewed by me and considered in my medical decision making (see chart for details).    36 yo female presenting with a one-day history of left neck pain with radiation to left shoulder and upper left arm after a mild twisting injury at home. Trial of NSAIDs and muscle relaxer will be offered. Patient also advised to go warm heat compresses.   Discussed diagnosis and treatment with patient. All questions have been answered and all concerns have been addressed. The patient verbalized understanding and had no further questions   Final Clinical Impressions(s) / UC Diagnoses   Final diagnoses:  Acute strain of neck muscle, initial encounter    New Prescriptions New Prescriptions   No medications on file     Controlled Substance Prescriptions [place] Controlled Substance Registry consulted? Not Applicable   Lurline Idol, [place] 11/30/16 2026

## 2016-12-05 ENCOUNTER — Ambulatory Visit (INDEPENDENT_AMBULATORY_CARE_PROVIDER_SITE_OTHER): Payer: BLUE CROSS/BLUE SHIELD | Admitting: Medical

## 2016-12-05 ENCOUNTER — Encounter: Payer: Self-pay | Admitting: Medical

## 2016-12-05 VITALS — BP 122/80 | HR 83 | Wt 213.0 lb

## 2016-12-05 DIAGNOSIS — M542 Cervicalgia: Secondary | ICD-10-CM

## 2016-12-05 DIAGNOSIS — M436 Torticollis: Secondary | ICD-10-CM | POA: Diagnosis not present

## 2016-12-05 DIAGNOSIS — M549 Dorsalgia, unspecified: Secondary | ICD-10-CM | POA: Diagnosis not present

## 2016-12-05 DIAGNOSIS — M25512 Pain in left shoulder: Secondary | ICD-10-CM

## 2016-12-05 MED ORDER — HYDROCODONE-ACETAMINOPHEN 5-325 MG PO TABS: 1.0000 | ORAL_TABLET | Freq: Four times a day (QID) | ORAL | 0 refills | Status: DC | PRN

## 2016-12-05 NOTE — Patient Instructions (Addendum)
Recommendations:  You can use the Norco pain medication I gave today, 1 tablet every 4-6 hours for worse pain.   This can make you sleepy  The Norco is SHORT TERM for worse pain  Please continue the Ibuprofen every 8 hours, prescribed by urgent care  Please continue Flexeril the next few nights for muscle spasm  Stretch throughout the day  Use heat to the neck and shoulder  If not much improved within 3-4 days, let me know

## 2016-12-05 NOTE — Progress Notes (Signed)
Subjective: Chief Complaint  Patient presents with  . Arm Pain    arm and shoulder pain left  x 6 days , can't lift her arm    Here for left arm pain x 6 days.  Can't lift the arm above head.  Thought it was a pulled muscle, but worse.  No recent injury, trauma or fall.   No numbness, tingling or weakness.   Lower arm is ok.  Just upper arm is sore, has sharp pain in back of left shoulder.   No burning pain.   Has left neck and upper back pain.  No recent worse strenuous activity.  Went to urgent care for same issue about 5 days ago.  Was given medication.  Using the muscle relaxer but it doesn't seem to help.  Also has used icy hot.  Has used some ibuprofen.  Right handed.  No other aggravating or relieving factors. No other complaint.   Past Medical History:  Diagnosis Date  . BV (bacterial vaginosis)   . Chlamydia   . Low HDL (under 40) 2015  . Obesity   . Obesity   . Seasonal allergic rhinitis   . UTI (lower urinary tract infection)    Current Outpatient Prescriptions on File Prior to Visit  Medication Sig Dispense Refill  . ferrous gluconate (FERGON) 324 MG tablet Take 1 tablet (324 mg total) by mouth 2 (two) times daily with a meal. 60 tablet 2  . cyclobenzaprine (FLEXERIL) 10 MG tablet Take 1 tablet (10 mg total) by mouth 2 (two) times daily as needed for muscle spasms. (Patient not taking: Reported on 12/05/2016) 20 tablet 0  . ibuprofen (ADVIL,MOTRIN) 800 MG tablet Take 1 tablet (800 mg total) by mouth 3 (three) times daily. (Patient not taking: Reported on 12/05/2016) 21 tablet 0   No current facility-administered medications on file prior to visit.    ROS as in subjective   Objective: BP 122/80   Pulse 83   Wt 213 lb (96.6 kg)   LMP 11/24/2016   SpO2 97%   BMI 37.73 kg/m   Gen: wd, wn, nad Neck: decreased neck extension, left rotation given pain and spasm of muscle, tender left neck Tender left upper back paraspinal, otherwise back nontender Left shoulder nontender,  but pain in shoulder with flexion and abduction over 80 degrees.  Passive ROM to 100 egress but with pain.  No laxity, no swelling.  Rest of left arm unremarkable Arms neurovascularly intact     Assessment: Encounter Diagnoses  Name Primary?  . Neck pain Yes  . Upper back pain   . Torticollis   . Acute pain of left shoulder      Plan: Symptoms and exam suggest torticollis, neck and upper back spasms, and shoulder inflammation.   Gave recommendations below, Norco short term for worse pain.   If not much improved within  The next 3-4 days let me know.  Consider massage therapy.  F/u with call back in a few days.   Patient Instructions  Recommendations:  You can use the Norco pain medication I gave today, 1 tablet every 4-6 hours for worse pain.   This can make you sleepy  The Norco is SHORT TERM for worse pain  Please continue the Ibuprofen every 8 hours, prescribed by urgent care  Please continue Flexeril the next few nights for muscle spasm  Stretch throughout the day  Use heat to the neck and shoulder  If not much improved within 3-4 days, let me  know  Keliyah was seen today for arm pain.  Diagnoses and all orders for this visit:  Neck pain  Upper back pain  Torticollis  Acute pain of left shoulder  Other orders -     HYDROcodone-acetaminophen (NORCO) 5-325 MG tablet; Take 1 tablet by mouth every 6 (six) hours as needed for moderate pain.

## 2017-09-02 ENCOUNTER — Emergency Department (HOSPITAL_COMMUNITY)
Admission: EM | Admit: 2017-09-02 | Discharge: 2017-09-02 | Disposition: A | Discharge status: Home / Self Care | Payer: BLUE CROSS/BLUE SHIELD | Attending: Emergency Medicine | Admitting: Emergency Medicine

## 2017-09-02 ENCOUNTER — Other Ambulatory Visit: Payer: Self-pay

## 2017-09-02 ENCOUNTER — Encounter (HOSPITAL_COMMUNITY): Payer: Self-pay

## 2017-09-02 DIAGNOSIS — M79602 Pain in left arm: Secondary | ICD-10-CM | POA: Diagnosis not present

## 2017-09-02 DIAGNOSIS — Z79899 Other long term (current) drug therapy: Secondary | ICD-10-CM | POA: Diagnosis not present

## 2017-09-02 DIAGNOSIS — M542 Cervicalgia: Secondary | ICD-10-CM | POA: Diagnosis not present

## 2017-09-02 DIAGNOSIS — M792 Neuralgia and neuritis, unspecified: Secondary | ICD-10-CM | POA: Insufficient documentation

## 2017-09-02 DIAGNOSIS — M5410 Radiculopathy, site unspecified: Secondary | ICD-10-CM | POA: Diagnosis not present

## 2017-09-02 MED ORDER — DIAZEPAM 5 MG PO TABS: 5.0000 mg | ORAL_TABLET | Freq: Two times a day (BID) | ORAL | 0 refills | Status: DC

## 2017-09-02 MED ORDER — IBUPROFEN 800 MG PO TABS
800.0000 mg | ORAL_TABLET | Freq: Once | ORAL | Status: AC
  Administered 2017-09-02: 800 mg via ORAL
  Filled 2017-09-02: qty 1

## 2017-09-02 MED ORDER — IBUPROFEN 800 MG PO TABS: 800.0000 mg | ORAL_TABLET | Freq: Three times a day (TID) | ORAL | 0 refills | Status: DC

## 2017-09-02 MED ORDER — PREDNISONE 20 MG PO TABS
60.0000 mg | ORAL_TABLET | Freq: Once | ORAL | Status: AC
  Administered 2017-09-02: 60 mg via ORAL
  Filled 2017-09-02: qty 3

## 2017-09-02 MED ORDER — DIAZEPAM 5 MG PO TABS
5.0000 mg | ORAL_TABLET | Freq: Once | ORAL | Status: AC
  Administered 2017-09-02: 5 mg via ORAL
  Filled 2017-09-02: qty 1

## 2017-09-02 NOTE — ED Provider Notes (Signed)
[facility] EMERGENCY DEPARTMENT Provider Note   CSN: [id number] Arrival date & time: 09/02/17  1042     History   Chief Complaint Chief Complaint  Patient presents with  . Neck Pain  . Arm Pain    HPI Joan Joyce is a 37 y.o. female.  The history is provided by the patient. No language interpreter was used.     37 year old female presenting for evaluation of neck and arm pain.  Patient reports she woke up yesterday morning with pain to the back of her neck radiates towards her left upper arm.  Pain is sharp, shooting, worsening with neck movement or with all movement.  As his worst pain was 10 out of 10, when resting, pain is 8 out of 10.  She tries taking ibuprofen, and Flexeril without adequate relief.  She denies associated fever, chills, lightheadedness, dizziness, chest pain, shortness of breath, arm weakness, or rash.  She denies any recent injury or heavy lifting.  She is right arm dominant.   Past Medical History:  Diagnosis Date  . BV (bacterial vaginosis)   . Chlamydia   . Low HDL (under 40) 2015  . Obesity   . Obesity   . Seasonal allergic rhinitis   . UTI (lower urinary tract infection)     Patient Active Problem List   Diagnosis Date Noted  . Anemia 06/06/2016  . Encounter for health maintenance examination in adult 06/06/2016  . ASCUS of cervix with negative high risk HPV 06/06/2016  . Vaccine counseling 06/06/2016  . Low serum HDL 09/02/2014  . Iron deficiency anemia 05/13/2013    Past Surgical History:  Procedure Laterality Date  . CESAREAN SECTION     x3; 2002,2005,2010  . COLPOSCOPY  2010   Dr. Aldona Bar  . TUBAL LIGATION       OB History   None      Home Medications    Prior to Admission medications   Medication Sig Start Date End Date Taking? Authorizing Provider  cyclobenzaprine (FLEXERIL) 10 MG tablet Take 1 tablet (10 mg total) by mouth 2 (two) times daily as needed for muscle spasms. Patient not taking:  Reported on 12/05/2016 11/30/16   Lurline Idol, FNP  ferrous gluconate (FERGON) 324 MG tablet Take 1 tablet (324 mg total) by mouth 2 (two) times daily with a meal. 08/04/16   Tysinger, Kermit Balo, PA-C  HYDROcodone-acetaminophen (NORCO) 5-325 MG tablet Take 1 tablet by mouth every 6 (six) hours as needed for moderate pain. 12/05/16   Tysinger, Kermit Balo, PA-C  ibuprofen (ADVIL,MOTRIN) 800 MG tablet Take 1 tablet (800 mg total) by mouth 3 (three) times daily. Patient not taking: Reported on 12/05/2016 11/30/16   Lurline Idol, FNP    Family History Family History  Problem Relation Age of Onset  . Cancer Mother        ovarian  . Hypertension Mother   . Diabetes Mother   . COPD Mother   . Heart disease Mother        arrhythmia  . Cancer Father        lung, throat, smoker  . Heart disease Maternal Grandmother   . Gallbladder disease Sister   . Stroke Neg Hx   . Hyperlipidemia Neg Hx     Social History Social History   Tobacco Use  . Smoking status: Never Smoker  . Smokeless tobacco: Never Used  Substance Use Topics  . Alcohol use: No  . Drug use: No  Allergies   Patient has no known allergies.   Review of Systems Review of Systems  All other systems reviewed and are negative.    Physical Exam Updated Vital Signs BP 118/77 (BP Location: Right Arm)   Pulse 73   Temp 98.4 F (36.9 C) (Oral)   Resp 16   Ht 5\' 3"  (1.6 m)   Wt 95.3 kg (210 lb)   LMP 08/21/2017 (Exact Date)   SpO2 100%   BMI 37.20 kg/m   Physical Exam  Constitutional: She is oriented to person, place, and time. She appears well-developed and well-nourished. No distress.  Obese female resting comfortably in bed in no acute discomfort.  HENT:  Head: Atraumatic.  Eyes: Pupils are equal, round, and reactive to light. Conjunctivae and EOM are normal.  Neck: Normal range of motion. Neck supple.  Tenderness along cervical and left paracervical spinal muscle on palpation without overlying skin  changes.  Cardiovascular: Normal rate, regular rhythm and intact distal pulses.  Pulmonary/Chest: Effort normal and breath sounds normal.  Musculoskeletal: She exhibits tenderness (Tenderness along left trapezius, left posterior shoulder with palpation.  Left arm with full range of motion but with reproducible pain.  Radial pulse 2+, form compartments soft.).  Neurological: She is alert and oriented to person, place, and time.  Skin: No rash noted.  Psychiatric: She has a normal mood and affect.  Nursing note and vitals reviewed.    ED Treatments / Results  Labs (all labs ordered are listed, but only abnormal results are displayed) Labs Reviewed - No data to display  EKG None  Radiology No results found.  Procedures Procedures (including critical care time)  Medications Ordered in ED Medications  predniSONE (DELTASONE) tablet 60 mg (has no administration in time range)  diazepam (VALIUM) tablet 5 mg (has no administration in time range)  ibuprofen (ADVIL,MOTRIN) tablet 800 mg (has no administration in time range)     Initial Impression / Assessment and Plan / ED Course  I have reviewed the triage vital signs and the nursing notes.  Pertinent labs & imaging results that were available during my care of the patient were reviewed by me and considered in my medical decision making (see chart for details).     BP 118/77 (BP Location: Right Arm)   Pulse 73   Temp 98.4 F (36.9 C) (Oral)   Resp 16   Ht 5\' 3"  (1.6 m)   Wt 95.3 kg (210 lb)   LMP 08/21/2017 (Exact Date)   SpO2 100%   BMI 37.20 kg/m    Final Clinical Impressions(s) / ED Diagnoses   Final diagnoses:  Radicular pain in left arm    ED Discharge Orders        Ordered    diazepam (VALIUM) 5 MG tablet  2 times daily     09/02/17 1308    ibuprofen (ADVIL,MOTRIN) 800 MG tablet  3 times daily     09/02/17 1308     12:37 PM Patient here with radicular left upper extremity pain.  No evidence to suggest  infection.  No evidence to suggest cardiopulmonary disease.  Low suspicion for meningitis.  Patient have been using Flexeril and ibuprofen without adequate improvement.  Plan to add prednisone, and volume as well as providing soft collar for support.  Reassurance given.  She is afebrile, vital signs stable, and maintained full range of motion to upper extremities.  She is neurovascular intact.  Return precautions discussed.   Fayrene Helperran, Ahmyah Gidley, PA-C 09/02/17 1309  Benjiman Core, MD 09/02/17 1530

## 2017-09-02 NOTE — ED Notes (Signed)
Patient verbalizes understanding of discharge instructions. Opportunity for questioning and answers were provided. Armband removed by staff, pt discharged from ED.  

## 2017-09-02 NOTE — ED Triage Notes (Signed)
Pt presents for evaluation of L sided neck and arm pain since yesterday. Pt reports hx of similar pain that was a pinched nerve. States she thinks it is the same. Denies cp/sob. Ambulatory.

## 2017-09-07 DIAGNOSIS — M9901 Segmental and somatic dysfunction of cervical region: Secondary | ICD-10-CM | POA: Diagnosis not present

## 2017-09-07 DIAGNOSIS — M5414 Radiculopathy, thoracic region: Secondary | ICD-10-CM | POA: Diagnosis not present

## 2017-09-07 DIAGNOSIS — M531 Cervicobrachial syndrome: Secondary | ICD-10-CM | POA: Diagnosis not present

## 2017-09-07 DIAGNOSIS — M5032 Other cervical disc degeneration, mid-cervical region, unspecified level: Secondary | ICD-10-CM | POA: Diagnosis not present

## 2017-09-10 ENCOUNTER — Telehealth: Payer: Self-pay | Admitting: Orthopedic Surgery

## 2017-09-10 DIAGNOSIS — M9901 Segmental and somatic dysfunction of cervical region: Secondary | ICD-10-CM | POA: Diagnosis not present

## 2017-09-10 DIAGNOSIS — M5414 Radiculopathy, thoracic region: Secondary | ICD-10-CM | POA: Diagnosis not present

## 2017-09-10 DIAGNOSIS — M531 Cervicobrachial syndrome: Secondary | ICD-10-CM | POA: Diagnosis not present

## 2017-09-10 DIAGNOSIS — M5032 Other cervical disc degeneration, mid-cervical region, unspecified level: Secondary | ICD-10-CM | POA: Diagnosis not present

## 2017-09-10 NOTE — Telephone Encounter (Signed)
(  cont'd) pain in shoulder and neck. States has been treating with [place]ropractor in [place], [facility], [address]. States has copy of film. Relayed our providers will need office notes and Xray report prior to scheduling, and also. States will speak with their office to request.

## 2017-09-10 NOTE — Telephone Encounter (Signed)
Patient called to inquire about possible "urgent" appointment for problem of pain in shoulder

## 2017-09-12 DIAGNOSIS — M5412 Radiculopathy, cervical region: Secondary | ICD-10-CM | POA: Diagnosis not present

## 2017-09-21 DIAGNOSIS — M542 Cervicalgia: Secondary | ICD-10-CM | POA: Diagnosis not present

## 2017-09-27 DIAGNOSIS — M5412 Radiculopathy, cervical region: Secondary | ICD-10-CM | POA: Diagnosis not present

## 2017-10-02 DIAGNOSIS — M5412 Radiculopathy, cervical region: Secondary | ICD-10-CM | POA: Diagnosis not present

## 2017-10-19 ENCOUNTER — Telehealth: Payer: Self-pay | Admitting: Medical

## 2017-10-19 NOTE — Telephone Encounter (Signed)
Get in for physical and preop exam.   I received note from [facility] for upcoming surgery.

## 2017-10-31 ENCOUNTER — Other Ambulatory Visit (HOSPITAL_COMMUNITY)
Admission: RE | Admit: 2017-10-31 | Discharge: 2017-10-31 | Disposition: A | Discharge status: Home / Self Care | Payer: BLUE CROSS/BLUE SHIELD | Source: Ambulatory Visit | Attending: Medical | Admitting: Medical

## 2017-10-31 ENCOUNTER — Encounter: Payer: Self-pay | Admitting: Medical

## 2017-10-31 ENCOUNTER — Ambulatory Visit (INDEPENDENT_AMBULATORY_CARE_PROVIDER_SITE_OTHER): Payer: BLUE CROSS/BLUE SHIELD | Admitting: Medical

## 2017-10-31 VITALS — BP 112/70 | HR 97 | Temp 98.0°F | Resp 16 | Ht 64.0 in | Wt 212.8 lb

## 2017-10-31 DIAGNOSIS — Z0000 Encounter for general adult medical examination without abnormal findings: Secondary | ICD-10-CM

## 2017-10-31 DIAGNOSIS — Z124 Encounter for screening for malignant neoplasm of cervix: Secondary | ICD-10-CM | POA: Diagnosis not present

## 2017-10-31 DIAGNOSIS — D649 Anemia, unspecified: Secondary | ICD-10-CM | POA: Diagnosis not present

## 2017-10-31 DIAGNOSIS — J029 Acute pharyngitis, unspecified: Secondary | ICD-10-CM

## 2017-10-31 DIAGNOSIS — M5412 Radiculopathy, cervical region: Secondary | ICD-10-CM

## 2017-10-31 DIAGNOSIS — Z7189 Other specified counseling: Secondary | ICD-10-CM

## 2017-10-31 DIAGNOSIS — R0789 Other chest pain: Secondary | ICD-10-CM | POA: Diagnosis not present

## 2017-10-31 DIAGNOSIS — Z136 Encounter for screening for cardiovascular disorders: Secondary | ICD-10-CM | POA: Insufficient documentation

## 2017-10-31 DIAGNOSIS — R87610 Atypical squamous cells of undetermined significance on cytologic smear of cervix (ASC-US): Secondary | ICD-10-CM

## 2017-10-31 DIAGNOSIS — Z6836 Body mass index (BMI) 36.0-36.9, adult: Secondary | ICD-10-CM

## 2017-10-31 DIAGNOSIS — J069 Acute upper respiratory infection, unspecified: Secondary | ICD-10-CM

## 2017-10-31 DIAGNOSIS — Z7185 Encounter for immunization safety counseling: Secondary | ICD-10-CM

## 2017-10-31 DIAGNOSIS — Z9851 Tubal ligation status: Secondary | ICD-10-CM

## 2017-10-31 DIAGNOSIS — Z01818 Encounter for other preprocedural examination: Secondary | ICD-10-CM | POA: Diagnosis not present

## 2017-10-31 LAB — POCT URINALYSIS DIP (PROADVANTAGE DEVICE)
BILIRUBIN UA: NEGATIVE mg/dL
Bilirubin, UA: NEGATIVE
Blood, UA: NEGATIVE
GLUCOSE UA: NEGATIVE mg/dL
LEUKOCYTES UA: NEGATIVE
Nitrite, UA: NEGATIVE
Specific Gravity, Urine: 1.03
UUROB: NEGATIVE
pH, UA: 6 (ref 5.0–8.0)

## 2017-10-31 NOTE — Patient Instructions (Signed)
Thanks for trusting us with your health care and for coming in for a physical today.  Below are some general recommendations I have for you:  Yearly screenings See your eye doctor yearly for routine vision care. See your dentist yearly for routine dental care including hygiene visits twice yearly. See me here yearly for a routine physical and preventative care visit   Specific Concerns today:  . EXERCISE!!!!!!!!!!!!! I recommend exercising most days of the week using a type of exercise that they would enjoy and stick to such as walking, running, swimming, hiking, biking, aerobics, etc.  I recommend a healthy diet.    Do's:  whole grains such as whole grain pasta, rice, whole grains breads and whole grain cereals.  Use small quantities such as 1/2 cup per serving or 2 slices of bread per serving.   Eat 3-5 fruits daily Eat beans at least once daily Eat almonds in small quantities at least 3 days per week   If they eat meat, I recommend small portions of lean meats such as chicken, fish, and [place]. Eat as much NON corn and NON potato vegetables as they like, particularly raw or steamed Drink several large glasses of water daily  Cautions: Limit red meat Limit corn and potatoes Limit sweets, cake, pie, candy Limit beer and alcohol Avoid fried food, fast food, large portions Avoid sugary drinks such as regular soda and sweet tea    Please follow up yearly for a physical.    I have included other useful information below for your review.  Preventative Care for Adults - Female      MAINTAIN REGULAR HEALTH EXAMS:  A routine yearly physical is a good way to check in with your primary care provider about your health and preventive screening. It is also an opportunity to share updates about your health and any concerns you have, and receive a thorough all-over exam.   Most health insurance companies pay for at least some preventative services.  Check with your health plan for  specific coverages.  WHAT PREVENTATIVE SERVICES DO WOMEN NEED?  Adult women should have their weight and blood pressure checked regularly.   Women age 36 and older should have their cholesterol levels checked regularly.  Women should be screened for cervical cancer with a Pap smear and pelvic exam beginning at either age 36, or 3 years after they become sexually activity.    Breast cancer screening generally begins at age 36 with a mammogram and breast exam by your primary care provider.    Beginning at age 36 and continuing to age 36, women should be screened for colorectal cancer.  Certain people may need continued testing until age 36.  Updating vaccinations is part of preventative care.  Vaccinations help protect against diseases such as the flu.  Osteoporosis is a disease in which the bones lose minerals and strength as we age. Women ages 36 and over should discuss this with their caregivers, as should women after menopause who have other risk factors.  Lab tests are generally done as part of preventative care to screen for anemia and blood disorders, to screen for problems with the kidneys and liver, to screen for bladder problems, to check blood sugar, and to check your cholesterol level.  Preventative services generally include counseling about diet, exercise, avoiding tobacco, drugs, excessive alcohol consumption, and sexually transmitted infections.    GENERAL RECOMMENDATIONS FOR GOOD HEALTH:  Healthy diet:  Eat a variety of foods, including fruit, vegetables, animal or  vegetable protein, such as meat, fish, chicken, and eggs, or beans, lentils, tofu, and grains, such as rice.  Drink plenty of water daily.  Decrease saturated fat in the diet, avoid lots of red meat, processed foods, sweets, fast foods, and fried foods.  Exercise:  Aerobic exercise helps maintain good heart health. At least 30-40 minutes of moderate-intensity exercise is recommended. For example, a brisk  walk that increases your heart rate and breathing. This should be done on most days of the week.   Find a type of exercise or a variety of exercises that you enjoy so that it becomes a part of your daily life.  Examples are running, walking, swimming, water aerobics, and biking.  For motivation and support, explore group exercise such as aerobic class, spin class, Zumba, Yoga,or  martial arts, etc.    Set exercise goals for yourself, such as a certain weight goal, walk or run in a race such as a 5k walk/run.  Speak to your primary care provider about exercise goals.  Disease prevention:  If you smoke or chew tobacco, find out from your caregiver how to quit. It can literally save your life, no matter how long you have been a tobacco user. If you do not use tobacco, never begin.   Maintain a healthy diet and normal weight. Increased weight leads to problems with blood pressure and diabetes.   The Body Mass Index or BMI is a way of measuring how much of your body is fat. Having a BMI above 27 increases the risk of heart disease, diabetes, hypertension, stroke and other problems related to obesity. Your caregiver can help determine your BMI and based on it develop an exercise and dietary program to help you achieve or maintain this important measurement at a healthful level.  High blood pressure causes heart and blood vessel problems.  Persistent high blood pressure should be treated with medicine if weight loss and exercise do not work.   Fat and cholesterol leaves deposits in your arteries that can block them. This causes heart disease and vessel disease elsewhere in your body.  If your cholesterol is found to be high, or if you have heart disease or certain other medical conditions, then you may need to have your cholesterol monitored frequently and be treated with medication.   Ask if you should have a cardiac stress test if your history suggests this. A stress test is a test done on a treadmill  that looks for heart disease. This test can find disease prior to there being a problem.  Menopause can be associated with physical symptoms and risks. Hormone replacement therapy is available to decrease these. You should talk to your caregiver about whether starting or continuing to take hormones is right for you.   Osteoporosis is a disease in which the bones lose minerals and strength as we age. This can result in serious bone fractures. Risk of osteoporosis can be identified using a bone density scan. Women ages 1165 and over should discuss this with their caregivers, as should women after menopause who have other risk factors. Ask your caregiver whether you should be taking a calcium supplement and Vitamin D, to reduce the rate of osteoporosis.   Avoid drinking alcohol in excess (more than two drinks per day).  Avoid use of street drugs. Do not share needles with anyone. Ask for professional help if you need assistance or instructions on stopping the use of alcohol, cigarettes, and/or drugs.  Brush your teeth twice a  day with fluoride toothpaste, and floss once a day. Good oral hygiene prevents tooth decay and gum disease. The problems can be painful, unattractive, and can cause other health problems. Visit your dentist for a routine oral and dental check up and preventive care every 6-12 months.   Look at your skin regularly.  Use a mirror to look at your back. Notify your caregivers of changes in moles, especially if there are changes in shapes, colors, a size larger than a pencil eraser, an irregular border, or development of new moles.  Safety:  Use seatbelts 100% of the time, whether driving or as a passenger.  Use safety devices such as hearing protection if you work in environments with loud noise or significant background noise.  Use safety glasses when doing any work that could send debris in to the eyes.  Use a helmet if you ride a bike or motorcycle.  Use appropriate safety gear for  contact sports.  Talk to your caregiver about gun safety.  Use sunscreen with a SPF (or skin protection factor) of 15 or greater.  Lighter skinned people are at a greater risk of skin cancer. Don't forget to also wear sunglasses in order to protect your eyes from too much damaging sunlight. Damaging sunlight can accelerate cataract formation.   Practice safe sex. Use condoms. Condoms are used for birth control and to help reduce the spread of sexually transmitted infections (or STIs).  Some of the STIs are gonorrhea (the clap), chlamydia, syphilis, trichomonas, herpes, HPV (human papilloma virus) and HIV (human immunodeficiency virus) which causes AIDS. The herpes, HIV and HPV are viral illnesses that have no cure. These can result in disability, cancer and death.   Keep carbon monoxide and smoke detectors in your home functioning at all times. Change the batteries every 6 months or use a model that plugs into the wall.   Vaccinations:  Stay up to date with your tetanus shots and other required immunizations. You should have a booster for tetanus every 10 years. Be sure to get your flu shot every year, since 5%-20% of the [place] population comes down with the flu. The flu vaccine changes each year, so being vaccinated once is not enough. Get your shot in the fall, before the flu season peaks.   Other vaccines to consider:  Human Papilloma Virus or HPV causes cancer of the cervix, and other infections that can be transmitted from person to person. There is a vaccine for HPV, and females should get immunized between the ages of 33 and 57. It requires a series of 3 shots.   Pneumococcal vaccine to protect against certain types of pneumonia.  This is normally recommended for adults age 66 or older.  However, adults younger than 36 years old with certain underlying conditions such as diabetes, heart or lung disease should also receive the vaccine.  Shingles vaccine to protect against Varicella Zoster if  you are older than age 72, or younger than 36 years old with certain underlying illness.  If you have not had the Shingrix vaccine, please call your insurer to inquire about coverage for the Shingrix vaccine given in 2 doses.   Some insurers cover this vaccine after age 37, some cover this after age 56.  If your insurer covers this, then call to schedule appointment to have this vaccine here  Hepatitis A vaccine to protect against a form of infection of the liver by a virus acquired from food.  Hepatitis B vaccine to protect against  a form of infection of the liver by a virus acquired from blood or body fluids, particularly if you work in health care.  If you plan to travel internationally, check with your local health department for specific vaccination recommendations.  Cancer Screening:  Breast cancer screening is essential to preventive care for women. All women age 29 and older should perform a breast self-exam every month. At age 58 and older, women should have their caregiver complete a breast exam each year. Women at ages 68 and older should have a mammogram (x-ray film) of the breasts. Your caregiver can discuss how often you need mammograms.    Cervical cancer screening includes taking a Pap smear (sample of cells examined under a microscope) from the cervix (end of the uterus). It also includes testing for HPV (Human Papilloma Virus, which can cause cervical cancer). Screening and a pelvic exam should begin at age 82, or 3 years after a woman becomes sexually active. Screening should occur every year, with a Pap smear but no HPV testing, up to age 78. After age 57, you should have a Pap smear every 3 years with HPV testing, if no HPV was found previously.   Most routine colon cancer screening begins at the age of 39. On a yearly basis, doctors may provide special easy to use take-home tests to check for hidden blood in the stool. Sigmoidoscopy or colonoscopy can detect the earliest forms of  colon cancer and is life saving. These tests use a small camera at the end of a tube to directly examine the colon. Speak to your caregiver about this at age 1, when routine screening begins (and is repeated every 5 years unless early forms of pre-cancerous polyps or small growths are found).

## 2017-10-31 NOTE — Progress Notes (Signed)
Subjective:   HPI  Joan Joyce is a 37 y.o. female who presents for Chief Complaint  Patient presents with  . cpe    cpe/pre op.     Medical care team includes: Genia Delysinger, Katianna Mcclenney S, PA-C here for primary care Dentist Eye doctor   Concerns: She is having surgery November 09, 2017 for cervical radiculopathy, neck pain left arm pain and numbness.  Needs preop physical.  Needs form completed  She notes some recent right chest wall pain but no injury no trauma no bruising no swelling no breast lump.  Pain is intermittent  Has a sore throat for the last few days but no fever.  Reported strep contacts at work  Periods regular.  No concern for STD.   No new sexual contacts since last STD screen   Reviewed their medical, surgical, family, social, medication, and allergy history and updated chart as appropriate.  Past Medical History:  Diagnosis Date  . BV (bacterial vaginosis)   . Chlamydia   . Low HDL (under 40) 2015  . Obesity   . Obesity   . Seasonal allergic rhinitis   . UTI (lower urinary tract infection)     Past Surgical History:  Procedure Laterality Date  . CESAREAN SECTION     x3; 2002,2005,2010  . COLPOSCOPY  2010   Dr. Aldona BarWein  . TUBAL LIGATION      Social History   Socioeconomic History  . Marital status: Single    Spouse name: Not on file  . Number of children: Not on file  . Years of education: Not on file  . Highest education level: Not on file  Occupational History  . Occupation: Aeronautical engineercafeteria     Employer: [employer]  Social Needs  . Financial resource strain: Not on file  . Food insecurity:    Worry: Not on file    Inability: Not on file  . Transportation needs:    Medical: Not on file    Non-medical: Not on file  Tobacco Use  . Smoking status: Never Smoker  . Smokeless tobacco: Never Used  Substance and Sexual Activity  . Alcohol use: No  . Drug use: No  . Sexual activity: Yes    Birth control/protection: Surgical  Lifestyle   . Physical activity:    Days per week: Not on file    Minutes per session: Not on file  . Stress: Not on file  Relationships  . Social connections:    Talks on phone: Not on file    Gets together: Not on file    Attends religious service: Not on file    Active member of club or organization: Not on file    Attends meetings of clubs or organizations: Not on file    Relationship status: Not on file  . Intimate partner violence:    Fear of current or ex partner: Not on file    Emotionally abused: Not on file    Physically abused: Not on file    Forced sexual activity: Not on file  Other Topics Concern  . Not on file  Social History Narrative   Working at [employer].  Was working cafeteria at [employer], no exercise, lives with boyfriend and 3 children; 17yo boy, 14yo girl, and 7yo boy.  As of 10/2017    Family History  Problem Relation Age of Onset  . Cancer Mother        ovarian  . Hypertension Mother   . Diabetes Mother   .  COPD Mother   . Heart disease Mother        arrhythmia  . Cancer Father        lung, throat, smoker  . Heart disease Maternal Grandmother   . Gallbladder disease Sister   . Stroke Neg Hx   . Hyperlipidemia Neg Hx     No current outpatient medications on file.  No Known Allergies   Review of Systems Constitutional: -fever, -chills, -sweats, -unexpected weight change, -decreased appetite, -fatigue Allergy: -sneezing, -itching, -congestion Dermatology: -changing moles, --rash, -lumps ENT: -runny nose, -ear pain, -sore throat, -hoarseness, -sinus pain, -teeth pain, - ringing in ears, -hearing loss, -nosebleeds Cardiology: -chest pain, -palpitations, -swelling, -difficulty breathing when lying flat, -waking up short of breath Respiratory: -cough, -shortness of breath, -difficulty breathing with exercise or exertion, -wheezing, -coughing up blood Gastroenterology: -abdominal pain, -nausea, -vomiting, -diarrhea, -constipation, -blood in stool,  -changes in bowel movement, -difficulty swallowing or eating Hematology: -bleeding, -bruising  Musculoskeletal: +arm pain, joint aches, -muscle aches, -joint swelling, -back pain, +neck pain, -cramping, -changes in gait Ophthalmology: denies vision changes, eye redness, itching, discharge Urology: -burning with urination, -difficulty urinating, -blood in urine, -urinary frequency, -urgency, -incontinence Neurology: -headache, -weakness, -tingling, -numbness, -memory loss, -falls, -dizziness Psychology: -depressed mood, -agitation, -sleep problems Breast/gyn: -breast tenderness, -discharge, -lumps, -vaginal discharge,- irregular periods, -heavy periods      Objective:   BP 112/70   Pulse 97   Temp 98 F (36.7 C) (Oral)   Resp 16   Ht 5\' 4"  (1.626 m)   Wt 212 lb 12.8 oz (96.5 kg)   LMP 10/13/2017   SpO2 98%   BMI 36.53 kg/m   General appearance: alert, no distress, WD/WN, African American female Skin: Right cheek with slightly raised 3 mm x 2 mm brown uniform macule well-defined, several skin tags on neck bilaterally, right lower flank with unchanged 3mm x 2mm brown flat macule, other scattered benign-appearing macules throughout, no worrisome lesions HEENT: normocephalic, conjunctiva/corneas normal, sclerae anicteric, PERRLA, EOMi, nares patent, no discharge or erythema, pharynx normal Oral cavity: MMM, tongue normal, teeth normal Neck: supple, no lymphadenopathy, no thyromegaly, no masses, normal ROM, no bruits Chest: non tender, normal shape and expansion Heart: RRR, normal S1, S2, no murmurs Lungs: CTA bilaterally, no wheezes, rhonchi, or rales Abdomen: +bs, soft, non tender, non distended, no masses, no hepatomegaly, no splenomegaly, no bruits Back: non tender, normal ROM, no scoliosis Musculoskeletal: upper extremities non tender, no obvious deformity, normal ROM throughout, lower extremities non tender, no obvious deformity, normal ROM throughout Extremities: no edema, no  cyanosis, no clubbing Pulses: 2+ symmetric, upper and lower extremities, normal cap refill Neurological: alert, oriented x 3, CN2-12 intact, strength normal upper extremities and lower extremities, sensation normal throughout, DTRs 2+ throughout, no cerebellar signs, gait normal Psychiatric: normal affect, behavior normal, pleasant   Breast: nontender, no masses or lumps, no skin changes, no nipple discharge or inversion, no axillary lymphadenopathy Gyn: Normal external genitalia without lesions, vagina with normal mucosa, cervix without lesions, no cervical motion tenderness, no abnormal vaginal discharge.  Uterus and adnexa not enlarged, nontender, no masses.  Pap performed.  Exam chaperoned by nurse. Rectal: anus normal appearing   Adult ECG Report  Indication: preop  Rate: 80 bpm  Rhythm: normal sinus rhythm  QRS Axis: 68 degrees  PR Interval:  QRS Duration: 88ms  QTc:  Conduction Disturbances: none  Other Abnormalities: none  Patient's cardiac risk factors are: obesity (BMI >= 30 kg/m2).  EKG comparison: none  Narrative Interpretation: normal EKG     Assessment and Plan :    Encounter Diagnoses  Name Primary?  . Routine general medical examination at a health care facility Yes  . Preop examination   . Screening for cervical cancer   . Chest wall pain   . Atypical squamous cells of undetermined significance on cytologic smear of cervix (ASC-US)   . Screening for heart disease   . Anemia, unspecified type   . Vaccine counseling   . Cervical radiculopathy   . BMI 36.0-36.9,adult   . Sore throat   . Viral URI   . H/O tubal ligation     Physical exam - discussed and counseled on healthy lifestyle, diet, exercise, preventative care, vaccinations, sick and well care, proper use of emergency dept and after hours care, and addressed their concerns.    Health screening: See your eye doctor yearly for routine vision care. See your dentist yearly for routine  dental care including hygiene visits twice yearly.  Cancer screening Discussed and advised monthly self breast exams Discussed pap smear recommendations.   Pap repeat today due to ASCUS last year  Vaccinations: Counseled on the following vaccines:  Influenza but she declined.  BMI >30 - counseled on diet, exercise  Anemia - recheck labs today.   Not taking iron in quite some time  Viral URI, sore throat - discussed supportive care  abnormal pap - repeat pap today  Cervical radiculopathy - pending labs, f/u as planned for surgery 11/09/17   Lalia was seen today for cpe.  Diagnoses and all orders for this visit:  Routine general medical examination at a health care facility -     POCT Urinalysis DIP (Proadvantage Device) -     Comprehensive metabolic panel -     CBC with Differential/Platelet -     Hemoglobin A1c -     EKG 12-Lead -     Iron -     Cytology - PAP  Preop examination -     Comprehensive metabolic panel -     CBC with Differential/Platelet -     Hemoglobin A1c -     EKG 12-Lead  Screening for cervical cancer -     Cytology - PAP  Chest wall pain  Atypical squamous cells of undetermined significance on cytologic smear of cervix (ASC-US)  Screening for heart disease -     EKG 12-Lead  Anemia, unspecified type -     CBC with Differential/Platelet  Vaccine counseling  Cervical radiculopathy  BMI 36.0-36.9,adult  Sore throat  Viral URI  H/O tubal ligation   Follow-up pending labs, yearly for physical

## 2017-11-01 ENCOUNTER — Other Ambulatory Visit: Payer: Self-pay | Admitting: Medical

## 2017-11-01 LAB — COMPREHENSIVE METABOLIC PANEL
ALT: 13 IU/L (ref 0–32)
AST: 13 IU/L (ref 0–40)
Albumin/Globulin Ratio: 1.5 (ref 1.2–2.2)
Albumin: 4.4 g/dL (ref 3.5–5.5)
Alkaline Phosphatase: 46 IU/L (ref 39–117)
BUN/Creatinine Ratio: 11 (ref 9–23)
BUN: 8 mg/dL (ref 6–20)
Bilirubin Total: 0.2 mg/dL (ref 0.0–1.2)
CO2: 24 mmol/L (ref 20–29)
Calcium: 9.8 mg/dL (ref 8.7–10.2)
Chloride: 103 mmol/L (ref 96–106)
Creatinine, Ser: 0.72 mg/dL (ref 0.57–1.00)
GFR calc non Af Amer: 108 mL/min/{1.73_m2} (ref >59)
GFR, EST AFRICAN AMERICAN: 125 mL/min/{1.73_m2} (ref >59)
Globulin, Total: 3 g/dL (ref 1.5–4.5)
Glucose: 93 mg/dL (ref 65–99)
Potassium: 4.3 mmol/L (ref 3.5–5.2)
Sodium: 139 mmol/L (ref 134–144)
TOTAL PROTEIN: 7.4 g/dL (ref 6.0–8.5)

## 2017-11-01 LAB — CBC WITH DIFFERENTIAL/PLATELET
BASOS ABS: 0 10*3/uL (ref 0.0–0.2)
BASOS: 0 %
EOS (ABSOLUTE): 0.2 10*3/uL (ref 0.0–0.4)
EOS: 3 %
HEMOGLOBIN: 10.3 g/dL — AB (ref 11.1–15.9)
Hematocrit: 32.8 % — ABNORMAL LOW (ref 34.0–46.6)
IMMATURE GRANS (ABS): 0 10*3/uL (ref 0.0–0.1)
Immature Granulocytes: 0 %
LYMPHS: 27 %
Lymphocytes Absolute: 2.4 10*3/uL (ref 0.7–3.1)
MCH: 23.4 pg — AB (ref 26.6–33.0)
MCHC: 31.4 g/dL — ABNORMAL LOW (ref 31.5–35.7)
MCV: 75 fL — ABNORMAL LOW (ref 79–97)
MONOCYTES: 7 %
Monocytes Absolute: 0.6 10*3/uL (ref 0.1–0.9)
NEUTROS ABS: 5.6 10*3/uL (ref 1.4–7.0)
Neutrophils: 63 %
Platelets: 251 10*3/uL (ref 150–450)
RBC: 4.4 x10E6/uL (ref 3.77–5.28)
RDW: 16.5 % — ABNORMAL HIGH (ref 12.3–15.4)
WBC: 8.9 10*3/uL (ref 3.4–10.8)

## 2017-11-01 LAB — HEMOGLOBIN A1C
Est. average glucose Bld gHb Est-mCnc: 114 mg/dL
HEMOGLOBIN A1C: 5.6 % (ref 4.8–5.6)

## 2017-11-01 LAB — IRON: Iron: 21 ug/dL — ABNORMAL LOW (ref 27–159)

## 2017-11-01 MED ORDER — FERROUS GLUCONATE 324 (38 FE) MG PO TABS: 324.0000 mg | ORAL_TABLET | Freq: Two times a day (BID) | ORAL | 2 refills | Status: AC

## 2017-11-05 LAB — CYTOLOGY - PAP
Adequacy: ABSENT
Diagnosis: NEGATIVE
HPV: NOT DETECTED

## 2017-11-05 LAB — RESULTS CONSOLE HPV: CHL HPV: NEGATIVE

## 2017-11-05 LAB — HM PAP SMEAR

## 2017-11-26 NOTE — Progress Notes (Addendum)
PCP: Crosby Oysteravid Tysinger, PA-C  Cardiologist: pt denies  EKG: 10/2017 in EPIC  Stress test: pt deneis  ECHO: pt denies  Cardiac Cath: pt denies  Chest x-ray: pt  Denies past year, no recent respiratory infections.complications

## 2017-11-26 NOTE — Pre-Procedure Instructions (Signed)
Joan Joyce  11/26/2017      [id number] [address] [name] [id number] Phone: [phone] Fax: [phone]    Your procedure is scheduled on December 06, 2017.  Report to [facility] Admitting at 1000 AM.  Call this number if you have problems the morning of surgery:  [phone]   Remember:  Do not eat or drink after midnight.    Take these medicines the morning of surgery with A SIP OF WATER- none  7 days prior to surgery STOP taking any Aspirin (unless otherwise instructed by your surgeon), Aleve, Naproxen, Ibuprofen, Motrin, Advil, Goody's, BC's, all herbal medications, fish oil, and all vitamins    Joan Joyce is not responsible for any belongings or valuables.  Contacts, dentures or bridgework may not be worn into surgery.  Leave your suitcase in the car.  After surgery it may be brought to your room.  For patients admitted to the hospital, discharge time will be determined by your treatment team.  Patients discharged the day of surgery will not be allowed to drive home.    [facility]- Preparing For Surgery  Before surgery, you can play an important role. Because skin is not sterile, your skin needs to be as free of germs as possible. You can reduce the number of germs on your skin by washing with CHG (chlorahexidine gluconate) Soap before surgery.  CHG is an antiseptic cleaner which kills germs and bonds with the skin to continue killing germs even after washing.    Oral Hygiene is also important to reduce your risk of infection.  Remember - BRUSH YOUR TEETH THE MORNING OF SURGERY WITH YOUR REGULAR TOOTHPASTE  Please do not use if you have an allergy to CHG or antibacterial soaps. If your skin becomes reddened/irritated stop using the CHG.  Do not shave (including legs and underarms) for at least 48 hours prior to first CHG shower. It is OK to shave your face.  Please follow these  instructions carefully.   1. Shower the NIGHT BEFORE SURGERY and the MORNING OF SURGERY with CHG.   2. If you chose to wash your hair, wash your hair first as usual with your normal shampoo.  3. After you shampoo, rinse your hair and body thoroughly to remove the shampoo.  4. Use CHG as you would any other liquid soap. You can apply CHG directly to the skin and wash gently with a scrungie or a clean washcloth.   5. Apply the CHG Soap to your body ONLY FROM THE NECK DOWN.  Do not use on open wounds or open sores. Avoid contact with your eyes, ears, mouth and genitals (private parts). Wash Face and genitals (private parts)  with your normal soap.  6. Wash thoroughly, paying special attention to the area where your surgery will be performed.  7. Thoroughly rinse your body with warm water from the neck down.  8. DO NOT shower/wash with your normal soap after using and rinsing off the CHG Soap.  9. Pat yourself dry with a CLEAN TOWEL.  10. Wear CLEAN PAJAMAS to bed the night before surgery, wear comfortable clothes the morning of surgery  11. Place CLEAN SHEETS on your bed the night of your first shower and DO NOT SLEEP WITH PETS.  Day of Surgery:              Do not wear jewelry, make-up or nail polish.  Do not  wear lotions, powders, or perfumes, or deodorant.  Do not shave 48 hours prior to surgery.    Do not bring valuables to the hospital.  Do not apply any deodorants/lotions.  Please wear clean clothes to the hospital/surgery center.   Remember to brush your teeth WITH YOUR REGULAR TOOTHPASTE.  Please read over the following fact sheets that you were given.

## 2017-11-27 ENCOUNTER — Other Ambulatory Visit: Payer: Self-pay

## 2017-11-27 ENCOUNTER — Encounter (HOSPITAL_COMMUNITY)
Admission: RE | Admit: 2017-11-27 | Discharge: 2017-11-27 | Disposition: A | Discharge status: Home / Self Care | Payer: BLUE CROSS/BLUE SHIELD | Source: Ambulatory Visit | Attending: Orthopedic Surgery | Admitting: Orthopedic Surgery

## 2017-11-27 ENCOUNTER — Encounter (HOSPITAL_COMMUNITY): Payer: Self-pay

## 2017-11-27 DIAGNOSIS — M5412 Radiculopathy, cervical region: Secondary | ICD-10-CM | POA: Insufficient documentation

## 2017-11-27 DIAGNOSIS — Z01812 Encounter for preprocedural laboratory examination: Secondary | ICD-10-CM | POA: Insufficient documentation

## 2017-11-27 LAB — CBC
HEMATOCRIT: 36.4 % (ref 36.0–46.0)
HEMOGLOBIN: 11.2 g/dL — AB (ref 12.0–15.0)
MCH: 24 pg — AB (ref 26.0–34.0)
MCHC: 30.8 g/dL (ref 30.0–36.0)
MCV: 78.1 fL (ref 78.0–100.0)
Platelets: 215 10*3/uL (ref 150–400)
RBC: 4.66 MIL/uL (ref 3.87–5.11)
RDW: 18.3 % — ABNORMAL HIGH (ref 11.5–15.5)
WBC: 7.3 10*3/uL (ref 4.0–10.5)

## 2017-11-27 LAB — SURGICAL PCR SCREEN
MRSA, PCR: NEGATIVE
Staphylococcus aureus: NEGATIVE

## 2017-12-06 ENCOUNTER — Observation Stay (HOSPITAL_COMMUNITY)
Admission: RE | Admit: 2017-12-06 | Discharge: 2017-12-07 | Disposition: A | Discharge status: Home / Self Care | Payer: BLUE CROSS/BLUE SHIELD | Source: Ambulatory Visit | Attending: Orthopedic Surgery | Admitting: Orthopedic Surgery

## 2017-12-06 ENCOUNTER — Ambulatory Visit (HOSPITAL_COMMUNITY): Payer: BLUE CROSS/BLUE SHIELD | Admitting: Anesthesiology

## 2017-12-06 ENCOUNTER — Ambulatory Visit (HOSPITAL_COMMUNITY): Payer: BLUE CROSS/BLUE SHIELD

## 2017-12-06 ENCOUNTER — Encounter (HOSPITAL_COMMUNITY): Payer: Self-pay | Admitting: Surgery

## 2017-12-06 ENCOUNTER — Other Ambulatory Visit: Payer: Self-pay

## 2017-12-06 ENCOUNTER — Ambulatory Visit (HOSPITAL_COMMUNITY)
Admission: RE | Disposition: A | Discharge status: Home / Self Care | Payer: Self-pay | Source: Ambulatory Visit | Attending: Orthopedic Surgery

## 2017-12-06 DIAGNOSIS — E669 Obesity, unspecified: Secondary | ICD-10-CM | POA: Diagnosis not present

## 2017-12-06 DIAGNOSIS — M5412 Radiculopathy, cervical region: Secondary | ICD-10-CM | POA: Diagnosis not present

## 2017-12-06 DIAGNOSIS — M47812 Spondylosis without myelopathy or radiculopathy, cervical region: Secondary | ICD-10-CM | POA: Diagnosis not present

## 2017-12-06 DIAGNOSIS — M50123 Cervical disc disorder at C6-C7 level with radiculopathy: Principal | ICD-10-CM | POA: Insufficient documentation

## 2017-12-06 DIAGNOSIS — Z9889 Other specified postprocedural states: Secondary | ICD-10-CM

## 2017-12-06 DIAGNOSIS — Z6837 Body mass index (BMI) 37.0-37.9, adult: Secondary | ICD-10-CM | POA: Insufficient documentation

## 2017-12-06 DIAGNOSIS — M50223 Other cervical disc displacement at C6-C7 level: Secondary | ICD-10-CM | POA: Diagnosis not present

## 2017-12-06 DIAGNOSIS — Z419 Encounter for procedure for purposes other than remedying health state, unspecified: Secondary | ICD-10-CM

## 2017-12-06 HISTORY — PX: CERVICAL DISC ARTHROPLASTY: SHX587

## 2017-12-06 LAB — POCT PREGNANCY, URINE: PREG TEST UR: NEGATIVE

## 2017-12-06 SURGERY — CERVICAL ANTERIOR DISC ARTHROPLASTY
Anesthesia: General

## 2017-12-06 MED ORDER — CEFAZOLIN SODIUM-DEXTROSE 2-4 GM/100ML-% IV SOLN
2.0000 g | Freq: Three times a day (TID) | INTRAVENOUS | Status: AC
  Administered 2017-12-06 – 2017-12-07 (×2): 2 g via INTRAVENOUS
  Filled 2017-12-06 (×2): qty 100

## 2017-12-06 MED ORDER — OXYCODONE HCL 5 MG PO TABS
5.0000 mg | ORAL_TABLET | ORAL | Status: DC | PRN
  Filled 2017-12-06: qty 1

## 2017-12-06 MED ORDER — MORPHINE SULFATE (PF) 2 MG/ML IV SOLN: 1.0000 mg | INTRAVENOUS | Status: DC | PRN

## 2017-12-06 MED ORDER — MAGNESIUM CITRATE PO SOLN: 1.0000 | Freq: Once | ORAL | Status: DC | PRN

## 2017-12-06 MED ORDER — METHOCARBAMOL 1000 MG/10ML IJ SOLN
500.0000 mg | Freq: Four times a day (QID) | INTRAVENOUS | Status: DC | PRN
  Filled 2017-12-06: qty 5

## 2017-12-06 MED ORDER — OXYCODONE HCL 5 MG/5ML PO SOLN: 5.0000 mg | Freq: Once | ORAL | Status: DC | PRN

## 2017-12-06 MED ORDER — SODIUM CHLORIDE 0.9% FLUSH
3.0000 mL | Freq: Two times a day (BID) | INTRAVENOUS | Status: DC
  Administered 2017-12-06: 3 mL via INTRAVENOUS

## 2017-12-06 MED ORDER — MENTHOL 3 MG MT LOZG: 1.0000 | LOZENGE | OROMUCOSAL | Status: DC | PRN

## 2017-12-06 MED ORDER — PROMETHAZINE HCL 25 MG/ML IJ SOLN: 6.2500 mg | INTRAMUSCULAR | Status: DC | PRN

## 2017-12-06 MED ORDER — THROMBIN (RECOMBINANT) 20000 UNITS EX SOLR
CUTANEOUS | Status: AC
  Filled 2017-12-06: qty 20000

## 2017-12-06 MED ORDER — OXYCODONE HCL 5 MG PO TABS: 5.0000 mg | ORAL_TABLET | Freq: Once | ORAL | Status: DC | PRN

## 2017-12-06 MED ORDER — LACTATED RINGERS IV SOLN
INTRAVENOUS | Status: DC | PRN
  Administered 2017-12-06: 11:00:00 via INTRAVENOUS

## 2017-12-06 MED ORDER — HYDROMORPHONE HCL 1 MG/ML IJ SOLN
INTRAMUSCULAR | Status: AC
  Filled 2017-12-06: qty 1

## 2017-12-06 MED ORDER — ONDANSETRON HCL 4 MG/2ML IJ SOLN
INTRAMUSCULAR | Status: AC
  Filled 2017-12-06: qty 2

## 2017-12-06 MED ORDER — FENTANYL CITRATE (PF) 250 MCG/5ML IJ SOLN
INTRAMUSCULAR | Status: AC
  Filled 2017-12-06: qty 5

## 2017-12-06 MED ORDER — 0.9 % SODIUM CHLORIDE (POUR BTL) OPTIME
TOPICAL | Status: DC | PRN
  Administered 2017-12-06: 1000 mL

## 2017-12-06 MED ORDER — MIDAZOLAM HCL 5 MG/5ML IJ SOLN
INTRAMUSCULAR | Status: DC | PRN
  Administered 2017-12-06: 2 mg via INTRAVENOUS

## 2017-12-06 MED ORDER — DEXAMETHASONE SODIUM PHOSPHATE 10 MG/ML IJ SOLN
INTRAMUSCULAR | Status: DC | PRN
  Administered 2017-12-06: 10 mg via INTRAVENOUS

## 2017-12-06 MED ORDER — LACTATED RINGERS IV SOLN: INTRAVENOUS | Status: DC

## 2017-12-06 MED ORDER — OXYCODONE-ACETAMINOPHEN 10-325 MG PO TABS: 1.0000 | ORAL_TABLET | ORAL | 0 refills | Status: DC | PRN

## 2017-12-06 MED ORDER — ONDANSETRON 4 MG PO TBDP: 4.0000 mg | ORAL_TABLET | Freq: Three times a day (TID) | ORAL | 0 refills | Status: AC | PRN

## 2017-12-06 MED ORDER — LIDOCAINE 2% (20 MG/ML) 5 ML SYRINGE
INTRAMUSCULAR | Status: AC
  Filled 2017-12-06: qty 5

## 2017-12-06 MED ORDER — METHOCARBAMOL 500 MG PO TABS
500.0000 mg | ORAL_TABLET | Freq: Four times a day (QID) | ORAL | Status: DC | PRN
  Administered 2017-12-06 – 2017-12-07 (×2): 500 mg via ORAL
  Filled 2017-12-06 (×3): qty 1

## 2017-12-06 MED ORDER — PROPOFOL 10 MG/ML IV BOLUS
INTRAVENOUS | Status: AC
  Filled 2017-12-06: qty 20

## 2017-12-06 MED ORDER — BUPIVACAINE-EPINEPHRINE (PF) 0.25% -1:200000 IJ SOLN
INTRAMUSCULAR | Status: AC
  Filled 2017-12-06: qty 30

## 2017-12-06 MED ORDER — POLYETHYLENE GLYCOL 3350 17 G PO PACK: 17.0000 g | PACK | Freq: Every day | ORAL | Status: DC | PRN

## 2017-12-06 MED ORDER — ROCURONIUM BROMIDE 100 MG/10ML IV SOLN
INTRAVENOUS | Status: DC | PRN
  Administered 2017-12-06: 25 mg via INTRAVENOUS
  Administered 2017-12-06: 50 mg via INTRAVENOUS

## 2017-12-06 MED ORDER — ACETAMINOPHEN 325 MG PO TABS: 650.0000 mg | ORAL_TABLET | ORAL | Status: DC | PRN

## 2017-12-06 MED ORDER — CEFAZOLIN SODIUM-DEXTROSE 2-4 GM/100ML-% IV SOLN
2.0000 g | INTRAVENOUS | Status: AC
  Administered 2017-12-06: 2 g via INTRAVENOUS
  Filled 2017-12-06: qty 100

## 2017-12-06 MED ORDER — OXYCODONE HCL 5 MG PO TABS: 10.0000 mg | ORAL_TABLET | ORAL | Status: DC | PRN

## 2017-12-06 MED ORDER — HYDROCODONE-ACETAMINOPHEN 5-325 MG PO TABS
2.0000 | ORAL_TABLET | ORAL | Status: DC | PRN
  Administered 2017-12-07: 2 via ORAL
  Filled 2017-12-06 (×2): qty 2

## 2017-12-06 MED ORDER — PROPOFOL 10 MG/ML IV BOLUS
INTRAVENOUS | Status: DC | PRN
  Administered 2017-12-06: 200 mg via INTRAVENOUS

## 2017-12-06 MED ORDER — FENTANYL CITRATE (PF) 100 MCG/2ML IJ SOLN
INTRAMUSCULAR | Status: DC | PRN
  Administered 2017-12-06: 50 ug via INTRAVENOUS
  Administered 2017-12-06 (×2): 100 ug via INTRAVENOUS

## 2017-12-06 MED ORDER — ONDANSETRON HCL 4 MG/2ML IJ SOLN
INTRAMUSCULAR | Status: DC | PRN
  Administered 2017-12-06: 4 mg via INTRAVENOUS

## 2017-12-06 MED ORDER — SODIUM CHLORIDE 0.9% FLUSH: 3.0000 mL | INTRAVENOUS | Status: DC | PRN

## 2017-12-06 MED ORDER — HYDROCODONE-ACETAMINOPHEN 5-325 MG PO TABS
1.0000 | ORAL_TABLET | ORAL | Status: DC | PRN
  Administered 2017-12-06 – 2017-12-07 (×2): 1 via ORAL
  Filled 2017-12-06: qty 1

## 2017-12-06 MED ORDER — ROCURONIUM BROMIDE 50 MG/5ML IV SOSY
PREFILLED_SYRINGE | INTRAVENOUS | Status: AC
  Filled 2017-12-06: qty 5

## 2017-12-06 MED ORDER — SODIUM CHLORIDE 0.9 % IV SOLN: 250.0000 mL | INTRAVENOUS | Status: DC

## 2017-12-06 MED ORDER — HEMOSTATIC AGENTS (NO CHARGE) OPTIME
TOPICAL | Status: DC | PRN
  Administered 2017-12-06: 1 via TOPICAL

## 2017-12-06 MED ORDER — BUPIVACAINE-EPINEPHRINE 0.25% -1:200000 IJ SOLN
INTRAMUSCULAR | Status: DC | PRN
  Administered 2017-12-06: 8 mL

## 2017-12-06 MED ORDER — LIDOCAINE HCL (CARDIAC) PF 100 MG/5ML IV SOSY
PREFILLED_SYRINGE | INTRAVENOUS | Status: DC | PRN
  Administered 2017-12-06: 80 mg via INTRAVENOUS

## 2017-12-06 MED ORDER — DOCUSATE SODIUM 100 MG PO CAPS
100.0000 mg | ORAL_CAPSULE | Freq: Two times a day (BID) | ORAL | Status: DC
  Administered 2017-12-06 – 2017-12-07 (×2): 100 mg via ORAL
  Filled 2017-12-06 (×2): qty 1

## 2017-12-06 MED ORDER — ONDANSETRON HCL 4 MG PO TABS: 4.0000 mg | ORAL_TABLET | Freq: Four times a day (QID) | ORAL | Status: DC | PRN

## 2017-12-06 MED ORDER — MIDAZOLAM HCL 2 MG/2ML IJ SOLN
INTRAMUSCULAR | Status: AC
  Filled 2017-12-06: qty 2

## 2017-12-06 MED ORDER — PHENOL 1.4 % MT LIQD: 1.0000 | OROMUCOSAL | Status: DC | PRN

## 2017-12-06 MED ORDER — FERROUS GLUCONATE 324 (38 FE) MG PO TABS
324.0000 mg | ORAL_TABLET | Freq: Two times a day (BID) | ORAL | Status: DC
  Administered 2017-12-07: 324 mg via ORAL
  Filled 2017-12-06 (×2): qty 1

## 2017-12-06 MED ORDER — SUGAMMADEX SODIUM 200 MG/2ML IV SOLN
INTRAVENOUS | Status: DC | PRN
  Administered 2017-12-06: 192.2 mg via INTRAVENOUS

## 2017-12-06 MED ORDER — DEXAMETHASONE SODIUM PHOSPHATE 10 MG/ML IJ SOLN
INTRAMUSCULAR | Status: AC
  Filled 2017-12-06: qty 1

## 2017-12-06 MED ORDER — ACETAMINOPHEN 650 MG RE SUPP: 650.0000 mg | RECTAL | Status: DC | PRN

## 2017-12-06 MED ORDER — ONDANSETRON HCL 4 MG/2ML IJ SOLN: 4.0000 mg | Freq: Four times a day (QID) | INTRAMUSCULAR | Status: DC | PRN

## 2017-12-06 MED ORDER — THROMBIN 20000 UNITS EX SOLR
CUTANEOUS | Status: DC | PRN
  Administered 2017-12-06: 20000 [IU] via TOPICAL

## 2017-12-06 MED ORDER — HYDROMORPHONE HCL 1 MG/ML IJ SOLN
0.2500 mg | INTRAMUSCULAR | Status: DC | PRN
  Administered 2017-12-06: 0.5 mg via INTRAVENOUS

## 2017-12-06 MED ORDER — ROCURONIUM BROMIDE 50 MG/5ML IV SOSY
PREFILLED_SYRINGE | INTRAVENOUS | Status: AC
  Filled 2017-12-06: qty 10

## 2017-12-06 MED ORDER — METHOCARBAMOL 500 MG PO TABS: 500.0000 mg | ORAL_TABLET | Freq: Three times a day (TID) | ORAL | 0 refills | Status: AC

## 2017-12-06 SURGICAL SUPPLY — 59 items
BIT MILLING 2.35X33.5 SHAFT (BIT) ×1 IMPLANT
BLADE CLIPPER SURG (BLADE) IMPLANT
CANISTER SUCT 3000ML PPV (MISCELLANEOUS) ×2 IMPLANT
CLSR STERI-STRIP ANTIMIC 1/2X4 (GAUZE/BANDAGES/DRESSINGS) ×2 IMPLANT
COVER MAYO STAND STRL (DRAPES) ×4 IMPLANT
COVER SURGICAL LIGHT HANDLE (MISCELLANEOUS) ×4 IMPLANT
CRADLE DONUT ADULT HEAD (MISCELLANEOUS) ×2 IMPLANT
DISC PRODISC-C MED DEEP 5MM (Neuro Prosthesis/Implant) ×1 IMPLANT
DRAPE C-ARM 42X72 X-RAY (DRAPES) ×2 IMPLANT
DRAPE C-ARMOR (DRAPES) IMPLANT
DRAPE POUCH INSTRU U-SHP 10X18 (DRAPES) ×2 IMPLANT
DRAPE SURG 17X23 STRL (DRAPES) ×2 IMPLANT
DRAPE U-SHAPE 47X51 STRL (DRAPES) ×2 IMPLANT
DRSG OPSITE POSTOP 3X4 (GAUZE/BANDAGES/DRESSINGS) ×2 IMPLANT
DURAPREP 26ML APPLICATOR (WOUND CARE) ×2 IMPLANT
ELECT COATED BLADE 2.86 ST (ELECTRODE) ×2 IMPLANT
ELECT PENCIL ROCKER SW 15FT (MISCELLANEOUS) ×2 IMPLANT
ELECT REM PT RETURN 9FT ADLT (ELECTROSURGICAL) ×2
ELECTRODE REM PT RTRN 9FT ADLT (ELECTROSURGICAL) ×1 IMPLANT
GLOVE BIO SURGEON STRL SZ 6.5 (GLOVE) ×2 IMPLANT
GLOVE BIOGEL PI IND STRL 6.5 (GLOVE) ×1 IMPLANT
GLOVE BIOGEL PI IND STRL 8.5 (GLOVE) ×1 IMPLANT
GLOVE BIOGEL PI INDICATOR 6.5 (GLOVE) ×1
GLOVE BIOGEL PI INDICATOR 8.5 (GLOVE) ×1
GLOVE SS BIOGEL STRL SZ 8.5 (GLOVE) ×1 IMPLANT
GLOVE SUPERSENSE BIOGEL SZ 8.5 (GLOVE) ×1
GOWN STRL REUS W/ TWL LRG LVL3 (GOWN DISPOSABLE) ×2 IMPLANT
GOWN STRL REUS W/TWL 2XL LVL3 (GOWN DISPOSABLE) ×2 IMPLANT
GOWN STRL REUS W/TWL LRG LVL3 (GOWN DISPOSABLE) ×4
KIT BASIN OR (CUSTOM PROCEDURE TRAY) ×2 IMPLANT
KIT TURNOVER KIT B (KITS) ×2 IMPLANT
NDL SPNL 18GX3.5 QUINCKE PK (NEEDLE) ×1 IMPLANT
NEEDLE SPNL 18GX3.5 QUINCKE PK (NEEDLE) ×2 IMPLANT
NS IRRIG 1000ML POUR BTL (IV SOLUTION) ×2 IMPLANT
NUT RETAINER F/PRODISC (ORTHOPEDIC DISPOSABLE SUPPLIES) ×2 IMPLANT
PACK ORTHO CERVICAL (CUSTOM PROCEDURE TRAY) ×2 IMPLANT
PACK UNIVERSAL I (CUSTOM PROCEDURE TRAY) ×2 IMPLANT
PAD ARMBOARD 7.5X6 YLW CONV (MISCELLANEOUS) ×4 IMPLANT
RESTRAINT LIMB HOLDER UNIV (RESTRAINTS) ×2 IMPLANT
SCREW PRODISC RETAINER 3.5X14 (PIN) ×2 IMPLANT
SPONGE INTESTINAL PEANUT (DISPOSABLE) IMPLANT
SPONGE SURGIFOAM ABS GEL SZ50 (HEMOSTASIS) ×2 IMPLANT
SURGIFLO W/THROMBIN 8M KIT (HEMOSTASIS) ×2 IMPLANT
SUT BONE WAX W31G (SUTURE) ×2 IMPLANT
SUT MNCRL AB 3-0 PS2 18 (SUTURE) ×1 IMPLANT
SUT MNCRL AB 3-0 PS2 27 (SUTURE) ×2 IMPLANT
SUT SILK 2 0 (SUTURE) ×2
SUT SILK 2-0 18XBRD TIE 12 (SUTURE) IMPLANT
SUT SILK 3 0 TIES 17X18 (SUTURE)
SUT SILK 3-0 18XBRD TIE BLK (SUTURE) IMPLANT
SUT VIC AB 2-0 CT1 18 (SUTURE) ×2 IMPLANT
SYR BULB IRRIGATION 50ML (SYRINGE) ×2 IMPLANT
SYR CONTROL 10ML LL (SYRINGE) IMPLANT
TAPE CLOTH 4X10 WHT NS (GAUZE/BANDAGES/DRESSINGS) ×2 IMPLANT
TAPE UMBILICAL COTTON 1/8X30 (MISCELLANEOUS) ×2 IMPLANT
TIP INSERTER MEDIUM (INSTRUMENTS) ×1 IMPLANT
TOWEL OR 17X24 6PK STRL BLUE (TOWEL DISPOSABLE) ×2 IMPLANT
TOWEL OR 17X26 10 PK STRL BLUE (TOWEL DISPOSABLE) ×2 IMPLANT
WATER STERILE IRR 1000ML POUR (IV SOLUTION) ×2 IMPLANT

## 2017-12-06 NOTE — Op Note (Signed)
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  Postoperative diagnosis: Same  Operative procedure: C6-7 total disc arthroplasty  First assistant: Anette Riedel, PA  Complications: None  Implants: Prodisc C  5 mm deep   Indications: This is a very pleasant 37 year old woman who presents with significant left C7 radiculopathy.  Despite appropriate conservative care she is continued to have progressive debilitating pain and loss in quality of life.  As a result we elected to move forward with surgery.  All appropriate risks benefits and alternatives were discussed with the patient and consent was obtained.  Operative report patient was brought the operating room placed supine the operating room table.  After successful induction of general anesthesia and endotracheally patient teds SCDs were applied and the arms were secured at the side and a roll was placed underneath the cervical spine.  The anterior cervical spine was then prepped and draped in a standard fashion.  Timeout was taken to confirm patient procedure and all other important data.  Once this was completed x-ray was brought into the field sterilely and identified the C6-7 disc space.  I marked out the incision site which I infiltrated with quarter percent Marcaine with epinephrine.  Transverse incision was made and sharp dissection was carried out down to and through the platysma.  I continued sharply dissecting along the medial border of the sternocleidomastoid through the deep cervical and prevertebral fascia.  I performed a standard Smith-Robinson approach to the anterior cervical spine.  I ultimately identified isolated and sacrificed the omohyoid in order to be adequate visualization of the 6 7 disc space.  A needle was placed into the 6 7 disc space and an x-ray was taken confirming I was at the appropriate level.  Using bipolar electrocautery I mobilized the longus coli muscle by  releasing it from its attachment onto the anterior cervical spine from the superior aspect to see 6 to the inferior aspect of C7.  Once this was done bilaterally I made sure hemostasis I then used a double-action Leksell rongeur to remove the anterior osteophyte.  55 mm self-retaining retractor blades were then placed in the endotracheal cuff was deflated.  I expanded the retractor system to the appropriate with and then reinflated the endotracheal cuff.  An annulotomy was then performed with a 15 blade scalpel and I used a combination of and curettes to remove the bulk of the disc material.  2 mm Kerrison rongeur was used to remove the overhanging osteophyte from the inferior aspect of the C6 vertebral body.  The AP guidance identified the midline of the cervical spine and placed my distraction pins.  They were both placed I distracted the intervertebral space with a lamina spreader and then maintained it with the distracting pins.  I continued removing disc material with my curettes and micropituitary rongeurs.  Once I was down to the posterior aspect of the vertebral body I used my 1 mm Kerrison Roger to resect the posterior osteophyte from the vertebral bodies of C6 and C7.  I then was able to use my nerve hook to sweep circumferentially in the left posterior lateral corner to remove a fragment of disc material.  Once this was removed I was able to develop a plane underneath the PLL and use my 1 mm Kerrison to resect the PLL.  At this point I had an adequate decompression centrally.  I then undercut the uncovertebral joints to adequately decompress the exiting nerve root.  At this  point I copiously irrigated the wound with normal saline and make sure all free fragments were removed.  I then trialed and elected to use the size 5 medium deep implant.  The targeting device was inserted and then I burred my transverse fin cuts.  I then cleaned the thin cuts and obtained the implant and malleted to the appropriate  depth.  Once it was properly situated and at the appropriate depth I irrigated the wound again and then used bone wax to seal the leading bony edges.  Hemostasis then obtained using FloSeal and bipolar electrocautery.  The distraction pins were removed and bone wax was used to stop the bone bleeding from the site and final x-rays were taken.  I had midline positioning of the implant on the AP and appropriate depth on the lateral.  The overall cervical sagittal alignment was improved.  Wound was now copiously irrigated with normal saline in the trachea and esophagus were returned to midline.  The platysma was closed with interrupted 2-0 Vicryl sutures and the skin with 3-0 Monocryl.  Steri-Strips and a dry dressing were applied and the patient was ultimately extubated transfer the PACU without incident.  The end of the case all needle sponge counts were correct.  There are no adverse intraoperative events.

## 2017-12-06 NOTE — Anesthesia Preprocedure Evaluation (Addendum)
Anesthesia Evaluation  Patient identified by MRN, date of birth, ID band Patient awake    Reviewed: Allergy & Precautions, NPO status , Patient's Chart, lab work & pertinent test results  Airway Mallampati: III  TM Distance: >3 FB Neck ROM: Full    Dental no notable dental hx.    Pulmonary neg pulmonary ROS,    Pulmonary exam normal breath sounds clear to auscultation       Cardiovascular negative cardio ROS Normal cardiovascular exam Rhythm:Regular Rate:Normal  ECG: NSR, rate 80   Neuro/Psych Pt reports numbness to fingertips in left hand negative psych ROS   GI/Hepatic negative GI ROS, Neg liver ROS,   Endo/Other  negative endocrine ROS  Renal/GU negative Renal ROS     Musculoskeletal negative musculoskeletal ROS (+)   Abdominal (+) + obese,   Peds  Hematology  (+) anemia ,   Anesthesia Other Findings Cervical radiculopathy C6-7  Reproductive/Obstetrics S/p BTL                            Anesthesia Physical Anesthesia Plan  ASA: II  Anesthesia Plan: General   Post-op Pain Management:    Induction: Intravenous  PONV Risk Score and Plan: 4 or greater and Midazolam, Ondansetron, Dexamethasone and Treatment may vary due to age or medical condition  Airway Management Planned: Oral ETT  Additional Equipment:   Intra-op Plan:   Post-operative Plan: Extubation in OR  Informed Consent: I have reviewed the patients History and Physical, chart, labs and discussed the procedure including the risks, benefits and alternatives for the proposed anesthesia with the patient or authorized representative who has indicated his/her understanding and acceptance.   Dental advisory given  Plan Discussed with: CRNA  Anesthesia Plan Comments:         Anesthesia Quick Evaluation

## 2017-12-06 NOTE — Anesthesia Postprocedure Evaluation (Signed)
Anesthesia Post Note  Patient: Joan Joyce  Procedure(s) Performed: CERVICAL ANTERIOR DISC ARTHROPLASTY C6-7 (N/A )     Patient location during evaluation: PACU Anesthesia Type: General Level of consciousness: awake and alert Pain management: pain level controlled Vital Signs Assessment: post-procedure vital signs reviewed and stable Respiratory status: spontaneous breathing, nonlabored ventilation, respiratory function stable and patient connected to nasal cannula oxygen Cardiovascular status: blood pressure returned to baseline and stable Postop Assessment: no apparent nausea or vomiting Anesthetic complications: no    Last Vitals:  Vitals:   12/06/17 1645 12/06/17 1723  BP:  126/74  Pulse: 66 66  Resp: 17 16  Temp:  36.6 C  SpO2: 98% 98%    Last Pain:  Vitals:   12/06/17 1943  TempSrc:   PainSc: 3                  Joan Joyce

## 2017-12-06 NOTE — Plan of Care (Signed)
  Problem: Education: Goal: Ability to verbalize activity precautions or restrictions will improve Outcome: Progressing Goal: Knowledge of the prescribed therapeutic regimen will improve Outcome: Progressing Goal: Understanding of discharge needs will improve Outcome: Progressing   

## 2017-12-06 NOTE — Transfer of Care (Signed)
Immediate Anesthesia Transfer of Care Note  Patient: Joan Joyce  Procedure(s) Performed: CERVICAL ANTERIOR DISC ARTHROPLASTY C6-7 (N/A )  Patient Location: PACU  Anesthesia Type:General  Level of Consciousness: awake, alert  and oriented  Airway & Oxygen Therapy: Patient Spontanous Breathing and Patient connected to nasal cannula oxygen  Post-op Assessment: Report given to RN, Post -op Vital signs reviewed and stable and Patient moving all extremities X 4  Post vital signs: Reviewed and stable  Last Vitals:  Vitals Value Taken Time  BP 114/93 12/06/2017  2:53 PM  Temp    Pulse 71 12/06/2017  2:55 PM  Resp 16 12/06/2017  2:55 PM  SpO2 100 % 12/06/2017  2:55 PM  Vitals shown include unvalidated device data.  Last Pain:  Vitals:   12/06/17 1013  TempSrc:   PainSc: 0-No pain      Patients Stated Pain Goal: 3 (12/06/17 1013)  Complications: No apparent anesthesia complications

## 2017-12-06 NOTE — Anesthesia Procedure Notes (Signed)
Procedure Name: Intubation Date/Time: 12/06/2017 12:20 PM Performed by: Mariea Clonts, CRNA Pre-anesthesia Checklist: Patient identified, Emergency Drugs available, Suction available and Patient being monitored Patient Re-evaluated:Patient Re-evaluated prior to induction Oxygen Delivery Method: Circle System Utilized Preoxygenation: Pre-oxygenation with 100% oxygen Induction Type: IV induction Ventilation: Mask ventilation without difficulty Laryngoscope Size: Mac and 4 Grade View: Grade I Tube type: Oral Number of attempts: 1 Airway Equipment and Method: Stylet and Oral airway Placement Confirmation: ETT inserted through vocal cords under direct vision,  positive ETCO2 and breath sounds checked- equal and bilateral Tube secured with: Tape Dental Injury: Teeth and Oropharynx as per pre-operative assessment

## 2017-12-06 NOTE — Brief Op Note (Signed)
12/06/2017  2:59 PM  PATIENT:  Joan Joyce  37 y.o. female  PRE-OPERATIVE DIAGNOSIS:  Cervical radiculopathy C6-7  POST-OPERATIVE DIAGNOSIS:  Cervical radiculopathy C6-7  PROCEDURE:  Procedure(s) with comments: CERVICAL ANTERIOR DISC ARTHROPLASTY C6-7 (N/A) - 2.5 hrs  SURGEON:  Surgeon(s) and Role:    Venita Lick, MD - Primary  PHYSICIAN ASSISTANT:   ASSISTANTS: Carmen Mayo   ANESTHESIA:   general  EBL:  100 mL   BLOOD ADMINISTERED:none  DRAINS: none   LOCAL MEDICATIONS USED:  MARCAINE     SPECIMEN:  No Specimen  DISPOSITION OF SPECIMEN:  N/A  COUNTS:  YES  TOURNIQUET:  * No tourniquets in log *  DICTATION: .Dragon Dictation  PLAN OF CARE: Admit for overnight observation  PATIENT DISPOSITION:  PACU - hemodynamically stable.

## 2017-12-06 NOTE — H&P (Signed)
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  Patient has also noticed numbness as well as weakness in the left arm that has gotten progressively worse.  She states that there was no acute trauma or injury that led to this.  She has been using narcotic and nonnarcotic medications in order to address the pain.  She presents today because of ongoing loss in quality of life and a desire to move forward with surgery.   Past Medical History:  Diagnosis Date  . BV (bacterial vaginosis)   . Chlamydia   . Low HDL (under 40) 2015  . Obesity   . Obesity   . Seasonal allergic rhinitis   . UTI (lower urinary tract infection)     No Known Allergies  No current facility-administered medications on file prior to encounter.    Current Outpatient Medications on File Prior to Encounter  Medication Sig Dispense Refill  . ferrous gluconate (FERGON) 324 MG tablet Take 1 tablet (324 mg total) by mouth 2 (two) times daily with a meal. 60 tablet 2    Physical Exam: Vitals:   12/06/17 1006  BP: 136/71  Pulse: 71  Resp: 20  Temp: 98.8 F (37.1 C)  SpO2: 100%   There is no height or weight on file to calculate BMI. She is alert she is oriented x3.  No shortness of breath or chest pain.  Lungs are clear to auscultation heart regular rate and rhythm.  Abdomen soft and nontender, no rebound tenderness, no incontinence of bowel and bladder.  No significant shoulder, elbow, wrist pain with isolated joint range of motion.  Negative Tinel sign at the elbow and wrist.  Normal gait pattern.  She does not require assistive devices for ambulation.  Negative Romberg's test.  5 out of 5 motor strength in the upper extremity set up for the left triceps which is 4 out of 5.  Sensation is decreased to light touch in the left C7  dermatome.  Positive nerve root tension signs in the C7 dermatome, positive Lhermitte sign.  Negative Babinski test, negative Hoffmann test, no clonus.  1+ deep tendon reflexes symmetrically in the upper extremity.  Image: Cervical x-rays: No acute fracture.  Degenerative disease is noted at multiple levels but there is no significant facet arthrosis at C6-7.  MRI of the cervical spine 09/21/2017: No cord signal changes.  Severe left C6-7 lateral recess and foraminal stenosis causing compression of the exiting C7 nerve root.  Moderate central stenosis at C6-7.  Severe left C3-4 foraminal and lateral recess stenosis.  Moderate C5-6 stenosis primarily on the right.  Moderate C4-5 central stenosis.  Moderate cervical kyphosis apex at centered at C5. A/P She is a very pleasant 37 year old female presented to my care with complaints of significant neck and radicular left arm pain.  Patient has weakness, numbness and dysesthesias in the C7 dermatome along with a positive Spurling sign in the C7 distribution.  Attempts at conservative management failed to alleviate the symptoms and so we elected to proceed with surgery.  I did review all appropriate risks benefits and alternatives and the patient expressed an understanding.  I reviewed this in the presence of her family.  We will plan on moving forward with the total disc replacement at C6-7.  If there is technical issues that prohibit replacement then we will move forward  with the fusion at that level.  Risks: Infection, bleeding, death, stroke, paralysis, ongoing or worse pain, need for additional surgery, throat pain, swallowing difficulties, leak of spinal fluid.  Adjacent segment disease that could require additional surgery.  Nonunion requiring supplemental posterior based surgery.  Inability to place the total disc with the plastic necessitating the need to be allowed to effusion.  Throat pain and swallowing difficulties which can become permanent.

## 2017-12-06 NOTE — Discharge Instructions (Signed)
Cervical Disk Replacement, Care After This sheet gives you information about how to care for yourself after your procedure. Your health care provider may also give you more specific instructions. If you have problems or questions, contact your health care provider. What can I expect after the procedure? After your procedure, it is common to have:  Pain or soreness.  Some pain, burning, or tingling in one or both arms.  Stiffness.  A hoarse voice.  Constipation.  Decreased appetite.  It may take 6-12 weeks for you to fully recover. You will need to wear a neck collar (cervical collar) to support your neck and keep it from moving. Hard collars are usually worn for about 6-12 weeks. Soft collars are usually worn for about 2 weeks. This may vary among health care providers and hospitals. Follow these instructions at home: If you have a cervical collar:  Do not remove the collar unless your health care provider tells you to do this.  Ask your health care provider before making any adjustments to your collar. Small adjustments may be needed over time to improve comfort and reduce pressure on your chin or on the back of your head.  If you are allowed to remove the collar for cleaning and bathing: ? Follow instructions from your health care provider about how to safely remove the collar. ? Wash and thoroughly dry the skin on your neck. Check your skin for irritation or sores. If you see any, tell your health care provider. ? Keep your collar clean by wiping it with mild soap and water and letting it air-dry completely. ? If your collar has removable pads, hand-wash them with soap and water and let them air-dry completely.  Keep long hair outside of the collar. Incision care  Follow instructions from your health care provider about how to take care of your incision. Make sure you: ? Wash your hands with soap and water before you change your bandage (dressing). If soap and water are not  available, use hand sanitizer. ? Change your dressing as told by your health care provider. ? Leave stitches (sutures), skin glue, or adhesive strips in place. These skin closures may need to stay in place for 2 weeks or longer. If adhesive strip edges start to loosen and curl up, you may trim the loose edges. Do not remove adhesive strips completely unless your health care provider tells you to do that.  Do not rub the incision area.  Check your incision area every day for signs of infection. Check for: ? More redness, swelling, or pain. ? More fluid or blood. ? Warmth. ? Pus or a bad smell. Bathing  Do not take baths, swim, or use a hot tub until your health care provider approves. If your health care provider approves, you may take showers starting 1-3 days after your procedure.  Do not get water directly onto your incision area. Activity  Move around gently for short periods or take short walks as directed by your health care provider.  Ask your health care provider what activities are safe for you, and ask when you may return to your normal activities. ? Avoid activities that require jumping or a lot of energy for 6-12 weeks, or as long as told by your health care provider. This includes jumping, aerobics, dancing, and most sports. ? Avoid lifting anything that is heavier than 3 lb (1.4 kg).  Do not drive or use heavy machinery until your health care provider approves.  If physical therapy  was prescribed, do exercises as told by your health care provider or physical therapist. General instructions  Take over-the-counter and prescription medicines only as told by your health care provider.  To prevent or treat constipation while you are taking prescription pain medicine, your health care provider may recommend that you: ? Drink enough fluid to keep your urine clear or pale yellow. ? Take over-the-counter or prescription medicines. ? Eat foods that are high in fiber, such as  fresh fruits and vegetables, whole grains, and beans. ? Limit foods that are high in fat and processed sugars, such as fried and sweet foods.  Eat a normal, healthy diet.  If you have a sore throat, you may take lozenges to help relieve soreness. It may help to eat soft foods for a few days.  Do not use any products that contain nicotine or tobacco, such as cigarettes and e-cigarettes. These can delay bone healing. If you need help quitting, ask your health care provider.  Keep all follow-up visits as told by your health care provider. This is important. Contact a health care provider if:  You have a fever or chills.  You have pain that does not get better with medicine.  You have difficulty: ? Swallowing. ? Breathing. ? Urinating. ? Having a bowel movement.  Your voice changes or becomes hoarse. Get help right away if:  You have more redness, swelling, or pain around your incision.  You have more fluid or blood coming from your incision.  Your incision feels warm to the touch.  You have pus or a bad smell coming from your incision.  You have pain, burning, or tingling in one or both of your arms that becomes different or gets worse.  You have pain, redness, warmth, or swelling in your lower leg. This information is not intended to replace advice given to you by your health care provider. Make sure you discuss any questions you have with your health care provider. Document Released: 03/04/2013 Document Revised: 11/24/2015 Document Reviewed: 11/24/2015 Elsevier Interactive Patient Education  2018 [employer] Cervical Collar A cervical collar is a device that supports your chin and the back of your head. It is used after a severe neck injury to protect your head and neck. It does this by restricting the movement of the top part of your spine, which is located in your neck. A cervical collar may be used when you have:  A fractured neck.  Ligament damage.  A spinal cord  injury.  What instructions should I follow?  Wear the collar for as long as your health care provider instructs.  Follow your health care provider's instructions about how to put on and take off your collar.  Do not make your collar so tight that you feel pain or it is hard for you to breathe.  Do not remove the collar unless your health care provider says it is okay. Ask your health care provider if you can remove the collar for showering or eating or to apply ice.  Do not drive a car until your health care provider says it is okay.  Keep all follow-up visits as directed by your health care provider. This is important. Any delay in getting necessary care can keep your injury from healing properly.  Apply ice to the injured area: ? Put ice in a plastic bag. ? Place a towel between your skin and the bag. ? Leave the ice on for 20 minutes, 2-3 times per day for the first  2 days. This information is not intended to replace advice given to you by your health care provider. Make sure you discuss any questions you have with your health care provider. Document Released: 11/20/2003 Document Revised: 07/08/2015 Document Reviewed: 10/06/2013 Elsevier Interactive Patient Education  [employer].

## 2017-12-07 ENCOUNTER — Encounter (HOSPITAL_COMMUNITY): Payer: Self-pay | Admitting: Orthopedic Surgery

## 2017-12-07 DIAGNOSIS — M50123 Cervical disc disorder at C6-C7 level with radiculopathy: Secondary | ICD-10-CM | POA: Diagnosis not present

## 2017-12-07 DIAGNOSIS — Z6837 Body mass index (BMI) 37.0-37.9, adult: Secondary | ICD-10-CM | POA: Diagnosis not present

## 2017-12-07 DIAGNOSIS — E669 Obesity, unspecified: Secondary | ICD-10-CM | POA: Diagnosis not present

## 2017-12-07 MED ORDER — HYDROCODONE-ACETAMINOPHEN 10-325 MG PO TABS: 1.0000 | ORAL_TABLET | Freq: Four times a day (QID) | ORAL | 0 refills | Status: AC | PRN

## 2017-12-07 MED FILL — Thrombin (Recombinant) For Soln 20000 Unit: CUTANEOUS | Qty: 1 | Status: AC

## 2017-12-07 NOTE — Progress Notes (Signed)
    Subjective: 1 Day Post-Op Procedure(s) (LRB): CERVICAL ANTERIOR DISC ARTHROPLASTY C6-7 (N/A) Patient reports pain as 7 on 0-10 scale.   Denies CP or SOB.  Voiding without difficulty. Positive flatus. Decreased arm pain Objective: Vital signs in last 24 hours: Temp:  [97.8 F (36.6 C)-98.8 F (37.1 C)] 98.1 F (36.7 C) (09/27 0257) Pulse Rate:  [62-78] 70 (09/27 0257) Resp:  [16-24] 18 (09/27 0257) BP: (106-136)/(60-93) 113/73 (09/27 0257) SpO2:  [97 %-100 %] 100 % (09/27 0257) Weight:  [96.1 kg] 96.1 kg (09/26 1746)  Intake/Output from previous day: 09/26 0701 - 09/27 0700 In: 903 [I.V.:903] Out: 100 [Blood:100] Intake/Output this shift: Total I/O In: 3 [I.V.:3] Out: -   Labs: No results for input(s): HGB in the last 72 hours. No results for input(s): WBC, RBC, HCT, PLT in the last 72 hours. No results for input(s): NA, K, CL, CO2, BUN, CREATININE, GLUCOSE, CALCIUM in the last 72 hours. No results for input(s): LABPT, INR in the last 72 hours.  Physical Exam: Neurologically intact ABD soft Sensation intact distally Dorsiflexion/Plantar flexion intact Incision: no drainage Compartment soft Body mass index is 37.53 kg/m.   Assessment/Plan: 1 Day Post-Op Procedure(s) (LRB): CERVICAL ANTERIOR DISC ARTHROPLASTY C6-7 (N/A) Advance diet Up with therapy  DC after cleared by PT Meds on chart  [facility], Baxter Kail for Dr. Venita Lick [facility] [phone] 12/07/2017, 6:52 AM

## 2017-12-07 NOTE — Progress Notes (Signed)
OT Cancellation Note  Patient Details Name: Joan Joyce MRN: [id number] DOB: [birth year]   Cancelled Treatment:    Reason Eval/Treat Not Completed: OT screened, no needs identified, will sign off. Spoke with PT who is in agreement and OT will sign off at this time.  Evern Bio Tunis Gentle 12/07/2017, 8:54 AM  Sherryl Manges OTR/L Acute Rehabilitation Services Pager: [phone] Office: [phone]

## 2017-12-07 NOTE — Progress Notes (Signed)
Physical Therapy Evaluation and Discharge Patient Details Name: Joan Joyce MRN: [id number] DOB: [birth year] Today's Date: 12/07/2017   History of Present Illness  Pt is a 37 y.o. female s/p ant. cervical disc arthoplasty @ C6-C7. PMH is significant for BV,Chlamydia, and low HDL.    Clinical Impression  Patient is s/p above surgery resulting in the deficits listed below (see PT Problem List). PTA, pt was independent in mobility and working full time. At the time of the eval the pt performed ambulation and stairs with gross supervision to Mod I, without the use of AD.Pt educated on positioning, car transfers, generalized walking program, and overall safety with mobility at d/c.  All education completed and pt has no further questions. Pt does not require further services from physical therapy at this time. Thank you for this referral. Physical Therapy is signing off at this time, if need to change please reconsult.    Follow Up Recommendations No PT follow up;Supervision for mobility/OOB    Equipment Recommendations  None recommended by PT    Recommendations for Other Services       Precautions / Restrictions Precautions Precautions: Cervical Precaution Booklet Issued: Yes (comment) Precaution Comments: Precautions reviewed in full with pt Required Braces or Orthoses: Cervical Brace Cervical Brace: Soft collar;For comfort Restrictions Weight Bearing Restrictions: No      Mobility  Bed Mobility Overal bed mobility: Modified Independent             General bed mobility comments: Increased time and effort. Min cues for Log Roll sequencing  Transfers Overall transfer level: Needs assistance Equipment used: None Transfers: Sit to/from Stand Sit to Stand: Supervision;Modified independent (Device/Increase time)         General transfer comment: Supervision for safety progressing to Mod I for increased time secondary to pain.   Ambulation/Gait Ambulation/Gait  assistance: Supervision;Modified independent (Device/Increase time) Gait Distance (Feet): 350 Feet Assistive device: None Gait Pattern/deviations: Step-through pattern Gait velocity: decreased Gait velocity interpretation: 1.31 - 2.62 ft/sec, indicative of limited community ambulator General Gait Details: Min cues for posture and maintenance of cervical precautions.   Stairs Stairs: Yes Stairs assistance: Supervision;Modified independent (Device/Increase time) Stair Management: No rails;One rail Right;Step to pattern;Alternating pattern;Forwards Number of Stairs: 11 General stair comments: Ascended forwards with rail on the left with step-through pattern. Pt descended forwards with rail on the right with the step-to pattern with min cues for safety. Pt reports decreased numbness in L hand making it easier to grip the railing.   Wheelchair Mobility    Modified Rankin (Stroke Patients Only)       Balance Overall balance assessment: Mild deficits observed, not formally tested                                           Pertinent Vitals/Pain Pain Assessment: Faces Faces Pain Scale: Hurts little more Pain Location: Incision on neck Pain Descriptors / Indicators: Discomfort;Grimacing;Guarding;Sore Pain Intervention(s): Limited activity within patient's tolerance;Monitored during session;Repositioned    Home Living Family/patient expects to be discharged to:: Private residence Living Arrangements: Spouse/significant other;Children Available Help at Discharge: Family;Friend(s);Available 24 hours/day Type of Home: House Home Access: Stairs to enter Entrance Stairs-Rails: None Entrance Stairs-Number of Steps: 1 Home Layout: Two level;Bed/bath upstairs Home Equipment: None      Prior Function Level of Independence: Independent  Hand Dominance        Extremity/Trunk Assessment   Upper Extremity Assessment Upper Extremity Assessment:  Overall WFL for tasks assessed    Lower Extremity Assessment Lower Extremity Assessment: Overall WFL for tasks assessed    Cervical / Trunk Assessment Cervical / Trunk Assessment: Other exceptions Cervical / Trunk Exceptions: s/p cervical surgery  Communication   Communication: No difficulties  Cognition Arousal/Alertness: Awake/alert Behavior During Therapy: Flat affect Overall Cognitive Status: Within Functional Limits for tasks assessed                                        General Comments      Exercises     Assessment/Plan    PT Assessment Patent does not need any further PT services  PT Problem List         PT Treatment Interventions      PT Goals (Current goals can be found in the Care Plan section)  Acute Rehab PT Goals Patient Stated Goal: to go home today PT Goal Formulation: With patient Time For Goal Achievement: 12/14/17 Potential to Achieve Goals: Good    Frequency     Barriers to discharge        Co-evaluation               AM-PAC PT "6 Clicks" Daily Activity  Outcome Measure Difficulty turning over in bed (including adjusting bedclothes, sheets and blankets)?: A Little Difficulty moving from lying on back to sitting on the side of the bed? : A Little Difficulty sitting down on and standing up from a chair with arms (e.g., wheelchair, bedside commode, etc,.)?: A Little Help needed moving to and from a bed to chair (including a wheelchair)?: None Help needed walking in hospital room?: A Little Help needed climbing 3-5 steps with a railing? : A Little 6 Click Score: 19    End of Session Equipment Utilized During Treatment: Gait belt;Cervical collar Activity Tolerance: Patient tolerated treatment well Patient left: in chair;with call bell/phone within reach Nurse Communication: Mobility status PT Visit Diagnosis: Other abnormalities of gait and mobility (R26.89);Pain Pain - part of body: (incision)    Time:  1610-9604 PT Time Calculation (min) (ACUTE ONLY): 23 min   Charges:   PT Evaluation $PT Eval Moderate Complexity: 1 Mod PT Treatments $Gait Training: 8-22 mins        Donzetta Kohut, [place]  Student Physical Therapist Acute Rehab [phone]   Donzetta Kohut 12/07/2017, 1:10 PM

## 2017-12-09 NOTE — Discharge Summary (Signed)
Physician Discharge Summary  Patient ID: Joan Joyce MRN: [id number] DOB/AGE: Jan 18, 1981 37 y.o.  Admit date: 12/06/2017 Discharge date: 12/07/17  Admission Diagnoses:  Cervical radiculopath C6-7  Discharge Diagnoses:  Active Problems:   S/P cervical disc replacement   Past Medical History:  Diagnosis Date  . BV (bacterial vaginosis)   . Chlamydia   . Low HDL (under 40) 2015  . Obesity   . Obesity   . Seasonal allergic rhinitis   . UTI (lower urinary tract infection)     Surgeries: Procedure(s): CERVICAL ANTERIOR DISC ARTHROPLASTY C6-7 on 12/06/2017   Consultants (if any):   Discharged Condition: Improved  Hospital Course: Joan Joyce is an 37 y.o. female who was admitted 12/06/2017 with a diagnosis of Cervical Radiculopathy C6-7 and went to the operating room on 12/06/2017 and underwent the above named procedures.  Post op day one pt reports moderate level of pain controlled on oral medication. Pt reports decreased arm pain.  Pt is voiding w/o difficulty. Pt is evaluated by PT and OT and is cleared for DC.    She was given perioperative antibiotics:  Anti-infectives (From admission, onward)   Start     Dose/Rate Route Frequency Ordered Stop   12/06/17 1900  ceFAZolin (ANCEF) IVPB 2g/100 mL premix     2 g 200 mL/hr over 30 Minutes Intravenous Every 8 hours 12/06/17 1845 12/07/17 1029   12/06/17 0942  ceFAZolin (ANCEF) IVPB 2g/100 mL premix     2 g 200 mL/hr over 30 Minutes Intravenous 30 min pre-op 12/06/17 0942 12/06/17 1225    .  She was given sequential compression devices, early ambulation, and TED for DVT prophylaxis.  She benefited maximally from the hospital stay and there were no complications.    Recent vital signs:  Vitals:   12/07/17 0257 12/07/17 0732  BP: 113/73 (!) 106/58  Pulse: 70 69  Resp: 18 16  Temp: 98.1 F (36.7 C) 98.1 F (36.7 C)  SpO2: 100% 100%    Recent laboratory studies:  Lab Results  Component Value Date   HGB 11.2  (L) 11/27/2017   HGB 10.3 (L) 10/31/2017   HGB 9.0 (L) 05/12/2016   Lab Results  Component Value Date   WBC 7.3 11/27/2017   PLT 215 11/27/2017   No results found for: INR Lab Results  Component Value Date   NA 139 10/31/2017   K 4.3 10/31/2017   CL 103 10/31/2017   CO2 24 10/31/2017   BUN 8 10/31/2017   CREATININE 0.72 10/31/2017   GLUCOSE 93 10/31/2017    Discharge Medications:   Allergies as of 12/07/2017   No Known Allergies     Medication List    TAKE these medications   ferrous gluconate 324 MG tablet Commonly known as:  FERGON Take 1 tablet (324 mg total) by mouth 2 (two) times daily with a meal.   HYDROcodone-acetaminophen 10-325 MG tablet Commonly known as:  NORCO Take 1 tablet by mouth every 6 (six) hours as needed for up to 5 days.   methocarbamol 500 MG tablet Commonly known as:  ROBAXIN Take 1 tablet (500 mg total) by mouth 3 (three) times daily.   ondansetron 4 MG disintegrating tablet Commonly known as:  ZOFRAN-ODT Take 1 tablet (4 mg total) by mouth every 8 (eight) hours as needed.       Diagnostic Studies: Dg Cervical Spine 2-3 Views  Result Date: 12/06/2017 CLINICAL DATA:  Cervical disc replacement EXAM: DG C-ARM 61-120 MIN; CERVICAL SPINE -  2-3 VIEW COMPARISON:  None. FLUOROSCOPY TIME:  Radiation Exposure Index (as provided by the fluoroscopic device): 53.9 mGy If the device does not provide the exposure index: Fluoroscopy Time:  1 minutes 38 seconds Number of Acquired Images:  4 FINDINGS: Disc replacement is noted at C6-7. No soft tissue or bony abnormality is noted. IMPRESSION: Cervical disc replacement at C6-7 Electronically Signed   By: Alcide Clever M.D.   On: 12/06/2017 14:55   Dg C-arm 1-60 Min  Result Date: 12/06/2017 CLINICAL DATA:  Cervical disc replacement EXAM: DG C-ARM 61-120 MIN; CERVICAL SPINE - 2-3 VIEW COMPARISON:  None. FLUOROSCOPY TIME:  Radiation Exposure Index (as provided by the fluoroscopic device): 53.9 mGy If the device  does not provide the exposure index: Fluoroscopy Time:  1 minutes 38 seconds Number of Acquired Images:  4 FINDINGS: Disc replacement is noted at C6-7. No soft tissue or bony abnormality is noted. IMPRESSION: Cervical disc replacement at C6-7 Electronically Signed   By: Alcide Clever M.D.   On: 12/06/2017 14:55   Dg C-arm 1-60 Min  Result Date: 12/06/2017 CLINICAL DATA:  Cervical disc replacement EXAM: DG C-ARM 61-120 MIN; CERVICAL SPINE - 2-3 VIEW COMPARISON:  None. FLUOROSCOPY TIME:  Radiation Exposure Index (as provided by the fluoroscopic device): 53.9 mGy If the device does not provide the exposure index: Fluoroscopy Time:  1 minutes 38 seconds Number of Acquired Images:  4 FINDINGS: Disc replacement is noted at C6-7. No soft tissue or bony abnormality is noted. IMPRESSION: Cervical disc replacement at C6-7 Electronically Signed   By: Alcide Clever M.D.   On: 12/06/2017 14:55    Disposition:  Post op meds provided Pt will present to clinic in 2 weeks Discharge Instructions    Incentive spirometry RT   Complete by:  As directed       Follow-up Information    Venita Lick, MD Follow up in 2 week(s).   Specialty:  Orthopedic Surgery Contact information: [address] [place] [address] [phone]            Signed: Kirt Boys 12/09/2017, 11:47 AM

## 2018-01-15 DIAGNOSIS — Z4789 Encounter for other orthopedic aftercare: Secondary | ICD-10-CM | POA: Diagnosis not present

## 2018-01-21 DIAGNOSIS — M542 Cervicalgia: Secondary | ICD-10-CM | POA: Diagnosis not present

## 2018-01-28 DIAGNOSIS — M542 Cervicalgia: Secondary | ICD-10-CM | POA: Diagnosis not present

## 2018-02-06 DIAGNOSIS — M5412 Radiculopathy, cervical region: Secondary | ICD-10-CM | POA: Diagnosis not present

## 2018-02-26 DIAGNOSIS — Z4789 Encounter for other orthopedic aftercare: Secondary | ICD-10-CM | POA: Diagnosis not present

## 2018-05-24 ENCOUNTER — Telehealth: Payer: Self-pay

## 2018-05-24 NOTE — Telephone Encounter (Signed)
Salt water gargles, warm fluids, Ibuprofen OTC, 3 tablets twice daily if not allergic to this.  If fever >100, sore throat, headache and no cough, could be strep.   So if those symptoms probably needs visit, particular if exposed to strep.

## 2018-05-24 NOTE — Telephone Encounter (Signed)
Patient called and stated she has a sore throat and pounding headache. Is there anything she can take over the counter to keep from coming in and being exposed to other germs. Please advise.

## 2018-05-24 NOTE — Telephone Encounter (Signed)
Patient notified of recommendations.  States she does not have fever.

## 2019-10-08 DIAGNOSIS — F4325 Adjustment disorder with mixed disturbance of emotions and conduct: Secondary | ICD-10-CM | POA: Diagnosis not present

## 2019-12-21 DIAGNOSIS — U071 COVID-19: Secondary | ICD-10-CM | POA: Diagnosis not present

## 2020-03-03 DIAGNOSIS — Z1152 Encounter for screening for COVID-19: Secondary | ICD-10-CM | POA: Diagnosis not present

## 2020-04-05 ENCOUNTER — Ambulatory Visit: Payer: BC Managed Care – PPO | Admitting: Medical

## 2020-04-05 ENCOUNTER — Encounter: Payer: Self-pay | Admitting: Medical

## 2020-04-05 ENCOUNTER — Other Ambulatory Visit: Payer: Self-pay

## 2020-04-05 VITALS — BP 138/78 | HR 75 | Temp 98.4°F | Ht 63.0 in | Wt 216.8 lb

## 2020-04-05 DIAGNOSIS — R21 Rash and other nonspecific skin eruption: Secondary | ICD-10-CM | POA: Diagnosis not present

## 2020-04-05 DIAGNOSIS — M25561 Pain in right knee: Secondary | ICD-10-CM | POA: Diagnosis not present

## 2020-04-05 DIAGNOSIS — R238 Other skin changes: Secondary | ICD-10-CM

## 2020-04-05 DIAGNOSIS — M25562 Pain in left knee: Secondary | ICD-10-CM | POA: Diagnosis not present

## 2020-04-05 MED ORDER — TRIAMCINOLONE ACETONIDE 0.1 % EX CREA: 1.0000 "application " | TOPICAL_CREAM | Freq: Two times a day (BID) | CUTANEOUS | 0 refills | Status: AC

## 2020-04-05 MED ORDER — VALACYCLOVIR HCL 1 G PO TABS: 2000.0000 mg | ORAL_TABLET | Freq: Two times a day (BID) | ORAL | 0 refills | Status: AC

## 2020-04-05 MED ORDER — IBUPROFEN 600 MG PO TABS: 600.0000 mg | ORAL_TABLET | Freq: Three times a day (TID) | ORAL | 0 refills | Status: AC | PRN

## 2020-04-05 NOTE — Progress Notes (Signed)
Subjective:  Joan Joyce is a 40 y.o. female who presents for Chief Complaint  Patient presents with  . Rash    Rash on mid lower back     Here today for rash on back.  For the past year or so she has got a recurrent rash on her lower back right at the start of the gluteal cleft along the location of her lower back tattoo.  She has gotten this 3-4 times a year for the last year or more.  This starts as a pink-reddish patches rash or small bumps.  It is itchy.  It typically last about 5 days.  Towards the end of the course the rash does get some crust.  She has not had this rash prior to the last year or so.  Personal history of herpes simplex virus and no known exposure to herpes simplex virus.  When she gets the rash she can feel a little bit of aches in her knees.  No injury no trauma or other recent issues with the knees.  Uses nothing in particular for the rash.  No other aggravating or relieving factors.    No other c/o.  The following portions of the patient's history were reviewed and updated as appropriate: allergies, current medications, past family history, past medical history, past social history, past surgical history and problem list.  ROS Otherwise as in subjective above  Objective: BP 138/78   Pulse 75   Temp 98.4 F (36.9 C)   Ht 5\' 3"  (1.6 m)   Wt 216 lb 12.8 oz (98.3 kg)   SpO2 98%   BMI 38.40 kg/m   General appearance: alert, no distress, well developed, well nourished, well-appearing African-American female Lower back just above the superior portion of gluteal cleft within a section of her lower back tattoo with a 2.5 to 3 cm roundish patches of erythema and small vesicular lesions that appear to be about 2 days into symptoms which is consistent with her history.  There are no unroofed vesicles   Assessment: Encounter Diagnoses  Name Primary?  . Rash Yes  . Vesicular lesion   . Arthralgia of both knees      Plan: We discussed her rash in typical  course of recurrence lasting around 5 days.  I discussed possible causes but I suspect strongly herpes simplex virus.  Less likely contact dermatitis or shingles or other.  Prescription today for medications below to use for flareups.  We discussed importance of handwashing and prevention of spread to others.  We discussed the usual nature of the illness and typical recurrent flareups that can occur labs as below.  I sent an electronic E consult for specialty provider review through [facility] on their behalf regarding rash.  I will get back in touch with patient pending recommendations from the [facility] consult.  Addendum:  I received feedback from the [facility] consult.  The [facility] provider also concurred that the most likely cause is herpes simplex virus  Joan Joyce was seen today for rash.  Diagnoses and all orders for this visit:  Rash -     HSV 1 antibody, IgG -     HSV 2 antibody, IgG -     HSV(herpes simplex vrs) 1+2 ab-IgM -     Sedimentation rate  Vesicular lesion -     HSV 1 antibody, IgG -     HSV 2 antibody, IgG -     HSV(herpes simplex vrs) 1+2 ab-IgM -  Sedimentation rate  Arthralgia of both knees -     HSV 1 antibody, IgG -     HSV 2 antibody, IgG -     HSV(herpes simplex vrs) 1+2 ab-IgM -     Sedimentation rate  Other orders -     triamcinolone (KENALOG) 0.1 %; Apply 1 application topically 2 (two) times daily. -     valACYclovir (VALTREX) 1000 MG tablet; Take 2 tablets (2,000 mg total) by mouth 2 (two) times daily. -     ibuprofen (ADVIL) 600 MG tablet; Take 1 tablet (600 mg total) by mouth every 8 (eight) hours as needed.    Follow up: pending labs

## 2020-04-05 NOTE — Patient Instructions (Addendum)
We discussed possible causes of rash  We will check a few labs today   Begin Valtrex 2 tablets or 2000g, twice daily for 2 days for "flare up"  You can use the Triamcinolone steroid cream on this rash the next 5-7 days for itching and rash  You can use Ibuprofen for pain and inflammation  Be very careful to wash hands thoroughly with soap and water before and after applying the cream  Assume the rash could be contagious, so don't let others touch the rash   We will call with labs results        Cold Sore  A cold sore, also called a fever blister, is a small, fluid-filled sore that forms inside the mouth or on the lips, gums, nose, chin, or cheeks. Cold sores can spread to other parts of the body, such as the eyes or fingers. In some people who have other medical conditions, cold sores can spread to multiple other body sites, including the genitals. Cold sores can spread from person to person (are contagious) until the sores crust over completely. Most cold sores go away within 2 weeks. What are the causes? Cold sores are caused by an infection from a common type of herpes simplex virus (HSV-1). HSV-1 is closely related to the HSV-2virus, which is the virus that causes genital herpes, but these viruses are not the same. Once a person is infected with HSV-1, the virus remains permanently in the body. HSV-1 is spread from person to person through close contact, such as through kissing, touching the affected area, or sharing personal items such as lip balm, razors, a drinking glass, or eating utensils. What increases the risk? You are more likely to develop this condition if you:  Are tired, stressed, or sick.  Are menstruating.  Are pregnant.  Take certain medicines.  Are exposed to cold weather or too much sun. What are the signs or symptoms? Symptoms of a cold sore outbreak go through different stages. These are the stages of a cold sore:  Tingling, itching, or burning is  felt 1-2 days before the outbreak.  Fluid-filled blisters appear on the lips, inside the mouth, on the nose, or on the cheeks.  The blisters start to ooze clear fluid.  The blisters dry up, and a yellow crust appears in their place.  The crust falls off. In some cases, other symptoms can develop during a cold sore outbreak. These can include:  Fever.  Sore throat.  Headache.  Muscle aches.  Swollen neck glands. How is this diagnosed? This condition is diagnosed based on your medical history and a physical exam. Your health care provider may do a blood test or may swab some fluid from your sore and then examine the swab in the lab. How is this treated? There is no cure for cold sores or HSV-1. There is also no vaccine for HSV-1. Most cold sores go away on their own without treatment within 2 weeks. Medicines cannot make the infection go away, but your health care provider may prescribe medicines to:  Help relieve some of the pain associated with the sores.  Work to stop the virus from multiplying.  Shorten healing time. Medicines may be in the form of creams, gels, pills, or a shot. Follow these instructions at home: Medicines  Take or apply over-the-counter and prescription medicines only as told by your health care provider.  Use a cotton-tip swab to apply creams or gels to your sores.  Ask your health care  provider if you can take lysine supplements. Research has found that lysine may help heal the cold sore faster and prevent outbreaks. Sore care  Do not touch the sores or pick the scabs.  Wash your hands often. Do not touch your eyes without washing your hands first.  Keep the sores clean and dry.  If directed, apply ice to the sores: ? Put ice in a plastic bag. ? Place a towel between your skin and the bag. ? Leave the ice on for 20 minutes, 2-3 times a day.   Eating and drinking  Eat a soft, bland diet. Avoid eating hot, cold, or salty foods.  Use a straw  if it hurts to drink out of a glass.  Eat foods that are rich in lysine, such as meat, fish, and dairy products.  Avoid sugary foods, chocolates, nuts, and grains. These foods are rich in a nutrient called arginine, which can cause the virus to multiply. Lifestyle  Do not kiss, have oral sex, or share personal items until your sores heal.  Stress, poor sleep, and being out in the sun can trigger outbreaks. Make sure you: ? Do activities that help you relax, such as deep breathing exercises or meditation. ? Get enough sleep. ? Apply sunscreen on your lips before you go out in the sun. Contact a health care provider if:  You have symptoms for more than 2 weeks.  You have pus coming from the sores.  You have redness that is spreading.  You have pain or irritation in your eye.  You get sores on your genitals.  Your sores do not heal within 2 weeks.  You have frequent cold sore outbreaks. Get help right away if you have:  A fever and your symptoms suddenly get worse.  A headache and confusion.  Fatigue or loss of appetite.  A stiff neck or sensitivity to light. Summary  A cold sore, also called a fever blister, is a small, fluid-filled sore that forms inside the mouth or on the lips, gums, nose, chin, or cheeks.  Most cold sores go away on their own without treatment within 2 weeks. Your health care provider may prescribe medicines to help relieve some of the pain, work to stop the virus from multiplying, and shorten healing time.  Wash your hands often. Do not touch your eyes without washing your hands first.  Do not kiss, have oral sex, or share personal items until your sores heal.  Contact a health care provider if your sores do not heal within 2 weeks. This information is not intended to replace advice given to you by your health care provider. Make sure you discuss any questions you have with your health care provider. Document Revised: 06/19/2018 Document Reviewed:  07/30/2017 Elsevier Patient Education  2021 [employer].

## 2020-04-06 LAB — HSV(HERPES SIMPLEX VRS) I + II AB-IGM: HSVI/II Comb IgM: <0.91 Ratio (ref 0.00–0.90)

## 2020-04-06 LAB — HSV 2 ANTIBODY, IGG: HSV 2 IgG, Type Spec: 11.4 index — ABNORMAL HIGH (ref 0.00–0.90)

## 2020-04-06 LAB — HSV 1 ANTIBODY, IGG: HSV 1 Glycoprotein G Ab, IgG: 4.17 index — ABNORMAL HIGH (ref 0.00–0.90)

## 2020-04-06 LAB — SEDIMENTATION RATE: Sed Rate: 20 mm/hr (ref 0–32)

## 2020-07-07 ENCOUNTER — Telehealth: Payer: BC Managed Care – PPO | Admitting: Medical

## 2020-07-07 ENCOUNTER — Other Ambulatory Visit (INDEPENDENT_AMBULATORY_CARE_PROVIDER_SITE_OTHER): Payer: BC Managed Care – PPO

## 2020-07-07 ENCOUNTER — Other Ambulatory Visit: Payer: Self-pay

## 2020-07-07 ENCOUNTER — Encounter: Payer: Self-pay | Admitting: Medical

## 2020-07-07 VITALS — Ht 63.0 in | Wt 210.0 lb

## 2020-07-07 DIAGNOSIS — R52 Pain, unspecified: Secondary | ICD-10-CM | POA: Diagnosis not present

## 2020-07-07 DIAGNOSIS — J069 Acute upper respiratory infection, unspecified: Secondary | ICD-10-CM

## 2020-07-07 DIAGNOSIS — R059 Cough, unspecified: Secondary | ICD-10-CM | POA: Diagnosis not present

## 2020-07-07 LAB — POCT INFLUENZA A/B
Influenza A, POC: NEGATIVE
Influenza B, POC: NEGATIVE

## 2020-07-07 LAB — POC COVID19 BINAXNOW: SARS Coronavirus 2 Ag: POSITIVE — AB

## 2020-07-07 NOTE — Progress Notes (Signed)
  Subjective:     Patient ID: Joan Joyce, female   DOB: 05-31-1980, 40 y.o.   MRN: [id number]  This visit type was conducted due to national recommendations for restrictions regarding the COVID-19 Pandemic (e.g. social distancing) in an effort to limit this patient's exposure and mitigate transmission in our community.  Due to their co-morbid illnesses, this patient is at least at moderate risk for complications without adequate follow up.  This format is felt to be most appropriate for this patient at this time.    Documentation for virtual audio and video telecommunications through [place] encounter:  The patient was located at home. The provider was located in the office. The patient did consent to this visit and is aware of possible charges through their insurance for this visit.  The other persons participating in this telemedicine service were none. Time spent on call was 20 minutes and in review of previous records 20 minutes total.  This virtual service is not related to other E/M service within previous 7 days.   HPI Chief Complaint  Patient presents with  . Cough    Cough and congestion started last night    Virtual consult today for cough and congestion.  Felt it come on last night.  Whole body aches.  This morning has worsening.  No sore throat, no NVD.   Maybe fever, but no sure.  Maybe mild SOB, no wheezing.  No rash.  No recent tick exposure.   No sick contacts.  She notes being good about fluid intake.   No abdominal pain, no problems with bowels or urine.  No reason to suspect UTI.   No recent flu or covid contacts.  No recent covid test.  No prior covid vaccine.    Review of Systems As in subjective    Objective:   Physical Exam Due to coronavirus pandemic stay at home measures, patient visit was virtual and they were not examined in person.   Ht 5\' 3"  (1.6 m)   Wt 210 lb (95.3 kg)   BMI 37.20 kg/m   Gen: mildly ill appearing No dyspnea, answers  questions in complete sentences       Assessment:     Encounter Diagnoses  Name Primary?  . Cough Yes  . Body aches   . Viral upper respiratory illness        Plan:     We discussed her symptoms suggest viral respiratory tract infection, flu like symptoms.  She will come to our office this afternoon for testing.  We discussed good hydration, rest, can use over-the-counter analgesic, over-the-counter emergency immune plus vitamin pack.  Discussed supportive care measures.  If worse, not improving or new symptoms then recheck or call back.  Joan Joyce was seen today for cough.  Diagnoses and all orders for this visit:  Cough  Body aches  Viral upper respiratory illness  f/u today for covid and flu screening

## 2021-10-20 DIAGNOSIS — Z6837 Body mass index (BMI) 37.0-37.9, adult: Secondary | ICD-10-CM | POA: Diagnosis not present

## 2021-10-20 DIAGNOSIS — Z113 Encounter for screening for infections with a predominantly sexual mode of transmission: Secondary | ICD-10-CM | POA: Diagnosis not present

## 2021-10-20 DIAGNOSIS — Z124 Encounter for screening for malignant neoplasm of cervix: Secondary | ICD-10-CM | POA: Diagnosis not present

## 2021-10-20 DIAGNOSIS — Z0000 Encounter for general adult medical examination without abnormal findings: Secondary | ICD-10-CM | POA: Diagnosis not present

## 2021-10-20 DIAGNOSIS — Z01419 Encounter for gynecological examination (general) (routine) without abnormal findings: Secondary | ICD-10-CM | POA: Diagnosis not present

## 2021-10-20 LAB — HM PAP SMEAR: HM Pap smear: NEGATIVE

## 2021-10-20 LAB — RESULTS CONSOLE HPV: CHL HPV: NEGATIVE

## 2021-10-25 ENCOUNTER — Encounter: Payer: Self-pay | Admitting: Internal Medicine

## 2021-10-28 ENCOUNTER — Encounter: Payer: Self-pay | Admitting: Internal Medicine

## 2021-11-01 ENCOUNTER — Encounter: Payer: Self-pay | Admitting: Medical

## 2021-11-16 ENCOUNTER — Encounter: Payer: Self-pay | Admitting: Internal Medicine

## 2021-12-14 ENCOUNTER — Telehealth: Payer: Self-pay | Admitting: Licensed Clinical Social Worker

## 2021-12-14 NOTE — Patient Outreach (Signed)
  Care Coordination   Initial Visit Note   12/14/2021 Name: Joan Joyce MRN: [id number] DOB: [birth year]  Lauretta Chester Dicarlo is a 41 y.o. year old female who sees Tysinger, Camelia Eng, PA-C for primary care. I spoke with  Heron Nay by phone today.  What matters to the patients health and wellness today?  Care Coordination    Goals Addressed             This Visit's Progress    COMPLETED: Care Coordination Activities-No Follow Up Required       Care Coordination Interventions: Active listening / Reflection utilized  Emotional Support Provided LCSW informed patient of care coordination services. Pt is not interested at this time and agreed to contact PCP, should needs arise  Informed pt of vaccine availability at PCP office         SDOH assessments and interventions completed:  No     Care Coordination Interventions Activated:  Yes  Care Coordination Interventions:  Yes, provided   Follow up plan: No further intervention required.   Encounter Outcome:  Pt. Refused   Christa See, MSW, [email or web address] Phone [phone] 4:14 PM

## 2021-12-14 NOTE — Patient Instructions (Signed)
Visit Information  Thank you for taking time to visit with me today. Please don't hesitate to contact me if I can be of assistance to you.   Following are the goals we discussed today:   Goals Addressed             This Visit's Progress    COMPLETED: Care Coordination Activities-No Follow Up Required       Care Coordination Interventions: Active listening / Reflection utilized  Emotional Support Provided LCSW informed patient of care coordination services. Pt is not interested at this time and agreed to contact PCP, should needs arise  Informed pt of vaccine availability at PCP office        If you are experiencing a Mental Health or [place] or need someone to talk to, please call the Suicide and Crisis Lifeline: 988 call 911   Patient verbalizes understanding of instructions and care plan provided today and agrees to view in [place]. Active MyChart status and patient understanding of how to access instructions and care plan via MyChart confirmed with patient.     No further follow up required:    Christa See, MSW, [email or web address] Phone [phone] 4:15 PM

## 2021-12-20 ENCOUNTER — Encounter: Payer: Self-pay | Admitting: Internal Medicine

## 2023-12-10 LAB — HM MAMMOGRAPHY

## 2023-12-11 LAB — LAB REPORT - SCANNED
EGFR: 79
TSH: 1.28

## 2023-12-13 ENCOUNTER — Encounter: Payer: Self-pay | Admitting: Obstetrics & Gynecology
